# Patient Record
Sex: Female | Born: 1970 | Race: White | Hispanic: No | Marital: Married | Smoking: Never smoker
Health system: Southern US, Community
[De-identification: ages and names within clinical notes are randomized; demographics above are authoritative.]

## PROBLEM LIST (undated history)

## (undated) DIAGNOSIS — M419 Scoliosis, unspecified: Secondary | ICD-10-CM

## (undated) DIAGNOSIS — T7840XA Allergy, unspecified, initial encounter: Secondary | ICD-10-CM

## (undated) DIAGNOSIS — M6530 Trigger finger, unspecified finger: Secondary | ICD-10-CM

## (undated) HISTORY — PX: LASIK: SHX215

## (undated) HISTORY — DX: Allergy, unspecified, initial encounter: T78.40XA

---
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
## 2000-12-04 ENCOUNTER — Other Ambulatory Visit: Admission: RE | Admit: 2000-12-04 | Discharge: 2000-12-04 | Payer: Self-pay | Admitting: Obstetrics and Gynecology

## 2003-09-08 ENCOUNTER — Other Ambulatory Visit: Admission: RE | Admit: 2003-09-08 | Discharge: 2003-09-08 | Payer: Self-pay | Admitting: Obstetrics and Gynecology

## 2006-06-05 ENCOUNTER — Other Ambulatory Visit: Admission: RE | Admit: 2006-06-05 | Discharge: 2006-06-05 | Payer: Self-pay | Admitting: Obstetrics and Gynecology

## 2008-07-15 ENCOUNTER — Encounter: Admission: RE | Admit: 2008-07-15 | Discharge: 2008-07-15 | Payer: Self-pay | Admitting: Obstetrics and Gynecology

## 2009-09-15 ENCOUNTER — Ambulatory Visit (HOSPITAL_BASED_OUTPATIENT_CLINIC_OR_DEPARTMENT_OTHER): Admission: RE | Admit: 2009-09-15 | Discharge: 2009-09-15 | Payer: Self-pay | Admitting: Orthopedic Surgery

## 2011-08-07 ENCOUNTER — Ambulatory Visit
Admission: RE | Admit: 2011-08-07 | Discharge: 2011-08-07 | Disposition: A | Discharge status: Home / Self Care | Payer: 59 | Source: Ambulatory Visit | Attending: Obstetrics and Gynecology | Admitting: Obstetrics and Gynecology

## 2011-08-07 ENCOUNTER — Other Ambulatory Visit: Payer: Self-pay | Admitting: Obstetrics and Gynecology

## 2011-08-07 DIAGNOSIS — Z1231 Encounter for screening mammogram for malignant neoplasm of breast: Secondary | ICD-10-CM

## 2012-08-21 ENCOUNTER — Other Ambulatory Visit: Payer: Self-pay | Admitting: Obstetrics and Gynecology

## 2012-08-21 DIAGNOSIS — Z1231 Encounter for screening mammogram for malignant neoplasm of breast: Secondary | ICD-10-CM

## 2012-08-22 ENCOUNTER — Ambulatory Visit
Admission: RE | Admit: 2012-08-22 | Discharge: 2012-08-22 | Disposition: A | Discharge status: Home / Self Care | Payer: 59 | Source: Ambulatory Visit | Attending: Obstetrics and Gynecology | Admitting: Obstetrics and Gynecology

## 2012-08-22 DIAGNOSIS — Z1231 Encounter for screening mammogram for malignant neoplasm of breast: Secondary | ICD-10-CM

## 2014-01-15 ENCOUNTER — Other Ambulatory Visit (HOSPITAL_COMMUNITY): Payer: Self-pay | Admitting: Orthopaedic Surgery

## 2014-01-15 DIAGNOSIS — M25512 Pain in left shoulder: Secondary | ICD-10-CM

## 2014-01-21 ENCOUNTER — Ambulatory Visit (HOSPITAL_COMMUNITY)
Admission: RE | Admit: 2014-01-21 | Discharge: 2014-01-21 | Disposition: A | Discharge status: Home / Self Care | Payer: 59 | Source: Ambulatory Visit | Attending: Orthopaedic Surgery | Admitting: Orthopaedic Surgery

## 2014-01-21 DIAGNOSIS — M25512 Pain in left shoulder: Secondary | ICD-10-CM

## 2014-01-21 DIAGNOSIS — M25519 Pain in unspecified shoulder: Secondary | ICD-10-CM | POA: Insufficient documentation

## 2014-01-29 ENCOUNTER — Ambulatory Visit: Payer: 59 | Attending: Orthopaedic Surgery | Admitting: Physical Therapy

## 2014-01-29 DIAGNOSIS — M25619 Stiffness of unspecified shoulder, not elsewhere classified: Secondary | ICD-10-CM | POA: Insufficient documentation

## 2014-01-29 DIAGNOSIS — M25519 Pain in unspecified shoulder: Secondary | ICD-10-CM | POA: Insufficient documentation

## 2014-01-29 DIAGNOSIS — R293 Abnormal posture: Secondary | ICD-10-CM | POA: Insufficient documentation

## 2014-01-29 DIAGNOSIS — IMO0001 Reserved for inherently not codable concepts without codable children: Secondary | ICD-10-CM | POA: Insufficient documentation

## 2014-02-10 ENCOUNTER — Ambulatory Visit: Payer: 59 | Admitting: Physical Therapy

## 2014-02-12 ENCOUNTER — Ambulatory Visit: Payer: 59 | Admitting: Rehabilitation

## 2014-02-16 ENCOUNTER — Ambulatory Visit: Payer: 59 | Admitting: Physical Therapy

## 2014-02-19 ENCOUNTER — Ambulatory Visit: Payer: 59 | Admitting: Physical Therapy

## 2014-03-09 ENCOUNTER — Encounter: Payer: 59 | Admitting: Physical Therapy

## 2014-04-07 ENCOUNTER — Other Ambulatory Visit: Payer: Self-pay

## 2014-04-07 ENCOUNTER — Ambulatory Visit
Admission: RE | Admit: 2014-04-07 | Discharge: 2014-04-07 | Disposition: A | Discharge status: Home / Self Care | Payer: 59 | Source: Ambulatory Visit

## 2014-04-07 DIAGNOSIS — Z1231 Encounter for screening mammogram for malignant neoplasm of breast: Secondary | ICD-10-CM

## 2015-06-29 ENCOUNTER — Ambulatory Visit (INDEPENDENT_AMBULATORY_CARE_PROVIDER_SITE_OTHER): Payer: 59 | Admitting: Physician Assistant

## 2015-06-29 VITALS — BP 116/72 | HR 88 | Temp 98.5°F | Resp 18 | Ht 67.75 in | Wt 152.6 lb

## 2015-06-29 DIAGNOSIS — Z0000 Encounter for general adult medical examination without abnormal findings: Secondary | ICD-10-CM | POA: Diagnosis not present

## 2015-06-29 DIAGNOSIS — Z13220 Encounter for screening for lipoid disorders: Secondary | ICD-10-CM | POA: Diagnosis not present

## 2015-06-29 DIAGNOSIS — Z130 Encounter for screening for diseases of the blood and blood-forming organs and certain disorders involving the immune mechanism: Secondary | ICD-10-CM

## 2015-06-29 DIAGNOSIS — Z13228 Encounter for screening for other metabolic disorders: Secondary | ICD-10-CM

## 2015-06-29 DIAGNOSIS — Z1329 Encounter for screening for other suspected endocrine disorder: Secondary | ICD-10-CM

## 2015-06-29 LAB — CBC
HEMATOCRIT: 40.7 % (ref 36.0–46.0)
Hemoglobin: 13.7 g/dL (ref 12.0–15.0)
MCH: 29.2 pg (ref 26.0–34.0)
MCHC: 33.7 g/dL (ref 30.0–36.0)
MCV: 86.8 fL (ref 78.0–100.0)
MPV: 10.3 fL (ref 8.6–12.4)
PLATELETS: 312 10*3/uL (ref 150–400)
RBC: 4.69 MIL/uL (ref 3.87–5.11)
RDW: 13.8 % (ref 11.5–15.5)
WBC: 6.4 10*3/uL (ref 4.0–10.5)

## 2015-06-29 LAB — COMPLETE METABOLIC PANEL WITH GFR
ALT: 28 U/L (ref 6–29)
AST: 27 U/L (ref 10–35)
Albumin: 4.5 g/dL (ref 3.6–5.1)
Alkaline Phosphatase: 87 U/L (ref 33–115)
BUN: 16 mg/dL (ref 7–25)
CALCIUM: 9.8 mg/dL (ref 8.6–10.2)
CHLORIDE: 103 mmol/L (ref 98–110)
CO2: 29 mmol/L (ref 20–31)
Creat: 0.78 mg/dL (ref 0.50–1.10)
GFR, Est African American: >89 mL/min (ref >=60)
GFR, Est Non African American: >89 mL/min (ref >=60)
Glucose, Bld: 89 mg/dL (ref 65–99)
POTASSIUM: 4.6 mmol/L (ref 3.5–5.3)
SODIUM: 139 mmol/L (ref 135–146)
Total Bilirubin: 0.5 mg/dL (ref 0.2–1.2)
Total Protein: 7.3 g/dL (ref 6.1–8.1)

## 2015-06-29 LAB — LIPID PANEL
CHOL/HDL RATIO: 3.8 ratio (ref <=5.0)
CHOLESTEROL: 216 mg/dL — AB (ref 125–200)
HDL: 57 mg/dL (ref >=46)
LDL Cholesterol: 124 mg/dL (ref <130)
Triglycerides: 176 mg/dL — ABNORMAL HIGH (ref <150)
VLDL: 35 mg/dL — AB (ref <30)

## 2015-06-29 LAB — T4, FREE: FREE T4: 0.9 ng/dL (ref 0.80–1.80)

## 2015-06-29 LAB — TSH: TSH: 3.624 u[IU]/mL (ref 0.350–4.500)

## 2015-06-29 NOTE — Progress Notes (Signed)
[facility] [address] [place] [address] [phone]  Date:  06/29/2015   Name:  Samantha Floyd   DOB:  [birth year]   MRN:  [id number]  PCP:  Horton Finer, MD    History of Present Illness:  Samantha Floyd is a 44 y.o. female patient who presents to [facility] for annual physical exam.  -Diet: Fruits and vegetables.  Breakfast bar.  Healthy dinner.  Hydrates.  Soda per week.  Rare fried.   -BM: Normal.  Less frequent q2days.  No blood in the stool.  No diarrhea or constipation. -Urine: No dysuria, hematuria, no frequency.  -Sleep: Well, 9am-4:30am.  Through the night and no difficulty getting to sleep.  In a ROS, she recalls a decreased thyroid level (t4,tsh).  This was never followed up, after her pcp retired, more than a year ago.  No fatigue, constipatin, skin changes.  No chest pain or palpitations.  No nausea or vomiting, or dizziness.     -She has 2 children which started college-so now and empty nest.  Has a good relationship with husband and great support.  They are active at their church.  No sexual relation concerns.    Menses has recently become more irregular.  Last eye exam within the year.    Mammogram about 1 year ago, was normal.   -Appt. in November with gyn with Dr. Marvel Plan with [facility], where she will have her pap  There are no active problems to display for this patient.   History reviewed. No pertinent past medical history.  Past Surgical History  Procedure Laterality Date  . Adenoidectomy  10/30/1974    Social History  Substance Use Topics  . Smoking status: Never Smoker   . Smokeless tobacco: None  . Alcohol Use: No    Family History  Problem Relation Age of Onset  . Hypertension Mother   . Cancer Father   . Hypertension Father   . Heart disease Maternal Grandmother   . Cancer Maternal Grandfather   . Stroke Maternal Grandfather   . Heart disease Paternal Grandmother   . Hyperlipidemia Paternal  Grandmother   . Hypertension Paternal Grandmother   . Stroke Paternal Grandmother   . Diabetes Paternal Grandfather   . Heart disease Paternal Grandfather   . Hypertension Paternal Grandfather     No Known Allergies  Medication list has been reviewed and updated.  No current outpatient prescriptions on file prior to visit.   No current facility-administered medications on file prior to visit.    Review of Systems  Constitutional: Negative for fever and chills.  HENT: Negative for hearing loss and sore throat.   Eyes: Negative for blurred vision.  Respiratory: Negative for cough, shortness of breath and wheezing.   Cardiovascular: Negative for chest pain, palpitations and leg swelling.  Gastrointestinal: Negative for nausea, vomiting, diarrhea and constipation.  Genitourinary: Negative for dysuria, frequency and hematuria.  Skin: Negative for itching and rash.  Neurological: Negative for dizziness.     Physical Examination: BP 116/72 mmHg  Pulse 88  Temp(Src) 98.5 F (36.9 C) (Oral)  Resp 18  Ht 5' 7.75" (1.721 m)  Wt 152 lb 9.6 oz (69.219 kg)  BMI 23.37 kg/m2  SpO2 98%  LMP 06/21/2015 Ideal Body Weight: Weight in (lb) to have BMI = 25: 162.9  Physical Exam  Constitutional: She is oriented to person, place, and time. She appears well-developed and well-nourished.  HENT:  Head: Normocephalic and atraumatic.  Right  Ear: External ear normal.  Left Ear: External ear normal.  Nose: Nose normal.  Mouth/Throat: Oropharynx is clear and moist.  Eyes: Conjunctivae and EOM are normal. Pupils are equal, round, and reactive to light.  Cardiovascular: Normal rate, regular rhythm and normal heart sounds.  Exam reveals no friction rub.   No murmur heard. Pulses:      Radial pulses are 2+ on the right side, and 2+ on the left side.  Pulmonary/Chest: Effort normal and breath sounds normal. No respiratory distress. She has no wheezes.  Abdominal: Soft. Bowel sounds are normal.  She exhibits no distension and no mass. There is no tenderness.  Musculoskeletal: Normal range of motion. She exhibits no edema or tenderness.  Neurological: She is alert and oriented to person, place, and time. She displays no tremor. No cranial nerve deficit. She exhibits normal muscle tone. She displays a negative Romberg sign. Coordination normal.  Skin: Skin is warm and dry.  Psychiatric: She has a normal mood and affect. Her behavior is normal.     Visual Acuity Screening   Right eye Left eye Both eyes  Without correction: 20/20 20/20 20/15   With correction:       Assessment and Plan: 44 year old female is here today for annual physical exam. She declines an influenza test today, but will have it with her work at [employer] ~ October She will schedule her mammogram herself. Pap scheduled for November.  Annual physical exam - Plan: CBC, COMPLETE METABOLIC PANEL WITH GFR, Lipid panel, TSH, T4, Free  Screening for metabolic disorder - Plan: COMPLETE METABOLIC PANEL WITH GFR  Screening for thyroid disorder - Plan: TSH, T4, Free  Screening for lipid disorders - Plan: Lipid panel  Screening for deficiency anemia - Plan: CBC    Ivar Drape, PA-C [facility] 06/29/2015 8:22 AM

## 2015-06-29 NOTE — Patient Instructions (Signed)
I will have your lab results within the next 10-14 days. Please try to get in 30 minutes of aerobic work 4 times per week.   Please schedule your mammogram.  Keeping You Healthy  Get These Tests 1. Blood Pressure- Have your blood pressure checked once a year by your health care provider.  Normal blood pressure is 120/80. 2. Weight- Have your body mass index (BMI) calculated to screen for obesity.  BMI is measure of body fat based on height and weight.  You can also calculate your own BMI at GravelBags.it. 3. Cholesterol- Have your cholesterol checked every 5 years starting at age 46 then yearly starting at age 46. 87. Chlamydia, HIV, and other sexually transmitted diseases- Get screened every year until age 46, then within three months of each new sexual provider. 5. Pap Test - Every 1-5 years; discuss with your health care provider. 6. Mammogram- Every 1-2 years starting at age 46--50  Take these medicines  Calcium with Vitamin D-Your body needs 1200 mg of Calcium each day and [phone] IU of Vitamin D daily.  Your body can only absorb 500 mg of Calcium at a time so Calcium must be taken in 2 or 3 divided doses throughout the day.  Multivitamin with folic acid- Once daily if it is possible for you to become pregnant.  Get these Immunizations  Gardasil-Series of three doses; prevents HPV related illness such as genital warts and cervical cancer.  Menactra-Single dose; prevents meningitis.  Tetanus shot- Every 10 years.  Flu shot-Every year.  Take these steps 1. Do not smoke-Your healthcare provider can help you quit.  For tips on how to quit go to www.smokefree.gov or call 1-800 QUITNOW. 2. Be physically active- Exercise 5 days a week for at least 30 minutes.  If you are not already physically active, start slow and gradually work up to 30 minutes of moderate physical activity.  Examples of moderate activity include walking briskly, dancing, swimming, bicycling,  etc. 3. Breast Cancer- A self breast exam every month is important for early detection of breast cancer.  For more information and instruction on self breast exams, ask your healthcare provider or https://www.patel.info/. 4. Eat a healthy diet- Eat a variety of healthy foods such as fruits, vegetables, whole grains, low fat milk, low fat cheeses, yogurt, lean meats, poultry and fish, beans, nuts, tofu, etc.  For more information go to www. Thenutritionsource.org 5. Drink alcohol in moderation- Limit alcohol intake to one drink or less per day. Never drink and drive. 6. Depression- Your emotional health is as important as your physical health.  If you're feeling down or losing interest in things you normally enjoy please talk to your healthcare provider about being screened for depression. 7. Dental visit- Brush and floss your teeth twice daily; visit your dentist twice a year. 8. Eye doctor- Get an eye exam at least every 2 years. 9. Helmet use- Always wear a helmet when riding a bicycle, motorcycle, rollerblading or skateboarding. 1. Safe sex- If you may be exposed to sexually transmitted infections, use a condom. 11. Seat belts- Seat belts can save your live; always wear one. 12. Smoke/Carbon Monoxide detectors- These detectors need to be installed on the appropriate level of your home. Replace batteries at least once a year. 13. Skin cancer- When out in the sun please cover up and use sunscreen 15 SPF or higher. 14. Violence- If anyone is threatening or hurting you, please tell your healthcare provider.

## 2015-08-11 ENCOUNTER — Ambulatory Visit
Admission: RE | Admit: 2015-08-11 | Discharge: 2015-08-11 | Disposition: A | Discharge status: Home / Self Care | Payer: 59 | Source: Ambulatory Visit

## 2015-08-11 ENCOUNTER — Other Ambulatory Visit: Payer: Self-pay

## 2015-08-11 DIAGNOSIS — Z1231 Encounter for screening mammogram for malignant neoplasm of breast: Secondary | ICD-10-CM

## 2015-10-08 ENCOUNTER — Ambulatory Visit (INDEPENDENT_AMBULATORY_CARE_PROVIDER_SITE_OTHER): Payer: 59 | Admitting: Internal Medicine

## 2015-10-08 ENCOUNTER — Encounter: Payer: Self-pay | Admitting: Internal Medicine

## 2015-10-08 VITALS — BP 105/73 | HR 82 | Temp 98.7°F | Resp 16 | Ht 67.0 in | Wt 154.0 lb

## 2015-10-08 DIAGNOSIS — H918X2 Other specified hearing loss, left ear: Secondary | ICD-10-CM | POA: Diagnosis not present

## 2015-10-08 DIAGNOSIS — H6502 Acute serous otitis media, left ear: Secondary | ICD-10-CM

## 2015-10-08 DIAGNOSIS — H9192 Unspecified hearing loss, left ear: Secondary | ICD-10-CM

## 2015-10-08 DIAGNOSIS — Z23 Encounter for immunization: Secondary | ICD-10-CM | POA: Diagnosis not present

## 2015-10-08 MED ORDER — PREDNISONE 20 MG PO TABS: ORAL_TABLET | ORAL | Status: DC

## 2015-10-08 MED ORDER — CETIRIZINE-PSEUDOEPHEDRINE ER 5-120 MG PO TB12
1.0000 | ORAL_TABLET | Freq: Two times a day (BID) | ORAL | Status: DC
Start: 2015-10-08 — End: 2016-08-28

## 2015-10-08 NOTE — Progress Notes (Signed)
   Subjective:    Patient ID: Samantha Floyd, female    DOB: 05-Oct-1971, 44 y.o.   MRN: [id number]  HPIc/o recent dizziness-positional vertigo sxt with muffling l ear--sl ring and sl decr hearing ?started p cold or allerg but "stopped up feeling continues after vertigo has resolved  Hx-tubes as child  Pap 11/4 Dr Marvel Plan Review of [facility] [place]    Objective:   Physical Exam BP 105/73 mmHg  Pulse 82  Temp(Src) 98.7 F (37.1 C)  Resp 16  Ht 5\' 7"  (1.702 m)  Wt 154 lb (69.854 kg)  BMI 24.11 kg/m2 PERRLA w/ EOMs conj Tm L has fluid bubbles and old opaque Tm scar R Tm opaque area but mostly clear decr hearing on L Nares cl No nodes Neuro intact       Assessment & Plan:  Acute serous otitis media of left ear, recurrence not specified  Need for Tdap vaccination - Plan: Tdap vaccine greater than or equal to 7yo IM  Hearing loss associated with syndrome, left  Meds ordered this encounter  Medications  . predniSONE (DELTASONE) 20 MG tablet    Sig: 3/3/2/2/1/1 single daily dose for 6 days    Dispense:  12 tablet    Refill:  0  . cetirizine-pseudoephedrine (ZYRTEC-D) 5-120 MG tablet    Sig: Take 1 tablet by mouth 2 (two) times daily.    Dispense:  60 tablet    Refill:  1   If not better in 10d call for ref to dr Thornell Mule

## 2015-12-06 DIAGNOSIS — H524 Presbyopia: Secondary | ICD-10-CM | POA: Diagnosis not present

## 2016-08-28 ENCOUNTER — Other Ambulatory Visit: Payer: Self-pay | Admitting: Obstetrics and Gynecology

## 2016-08-28 ENCOUNTER — Ambulatory Visit (INDEPENDENT_AMBULATORY_CARE_PROVIDER_SITE_OTHER): Payer: 59 | Admitting: Family Medicine

## 2016-08-28 VITALS — BP 108/60 | HR 78 | Temp 98.6°F | Resp 17 | Ht 67.0 in | Wt 139.0 lb

## 2016-08-28 DIAGNOSIS — Z13220 Encounter for screening for lipoid disorders: Secondary | ICD-10-CM

## 2016-08-28 DIAGNOSIS — Z1231 Encounter for screening mammogram for malignant neoplasm of breast: Secondary | ICD-10-CM

## 2016-08-28 DIAGNOSIS — Z0000 Encounter for general adult medical examination without abnormal findings: Secondary | ICD-10-CM

## 2016-08-28 DIAGNOSIS — Z1329 Encounter for screening for other suspected endocrine disorder: Secondary | ICD-10-CM

## 2016-08-28 LAB — POCT URINALYSIS DIP (MANUAL ENTRY)
BILIRUBIN UA: NEGATIVE
Bilirubin, UA: NEGATIVE
Glucose, UA: NEGATIVE
Nitrite, UA: NEGATIVE
PROTEIN UA: NEGATIVE
Urobilinogen, UA: 0.2
pH, UA: 5.5

## 2016-08-28 LAB — POC MICROSCOPIC URINALYSIS (UMFC): MUCUS RE: ABSENT

## 2016-08-28 LAB — POCT URINE PREGNANCY: PREG TEST UR: NEGATIVE

## 2016-08-28 MED ORDER — FEXOFENADINE HCL 30 MG PO TABS: 30.0000 mg | ORAL_TABLET | Freq: Every day | ORAL | 3 refills | Status: DC

## 2016-08-28 NOTE — Progress Notes (Addendum)
Patient ID: Samantha Floyd, female    DOB: [birth year], 45 y.o.   MRN: [id number]  PCP: No PCP Per Patient  Chief Complaint  Patient presents with  . Annual Exam    Subjective:   HPI 45 year old female presents for a complete annual physical. 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 Reports a history of chronic allergies which has led to chronic swelling of her tonsils.  She reports ongoing problems with bilateral ear pain and is considering getting a tonsillectomy. She takes Allegra-D daily. Reports recent intentional weight loss totaling 20 lbs since April. Reports that she is conscientious about her weight as her weight increases she experiences episodes of low back pain.  Denies any other acute problems.  Social History   Social History  . Marital status: Married    Spouse name: N/A  . Number of children: N/A  . Years of education: N/A   Occupational History  . Not on file.   Social History Main Topics  . Smoking status: Never Smoker  . Smokeless tobacco: Not on file  . Alcohol use No  . Drug use: No  . Sexual activity: Not on file   Other Topics Concern  . Not on file   Social History Narrative  . No narrative on file    Review of Systems  Constitutional: Positive for appetite change and fatigue.       20 lbs  HENT: Positive for ear discharge and hearing loss.        Toncils consider Dental visits  Eyes:       Bifocal for reading. Recent eye exam this year.  Respiratory: Negative.   Cardiovascular: Negative.   Gastrointestinal: Negative.        Everyday stool patterns.  Endocrine: Negative.   Genitourinary: Negative.  Negative for vaginal discharge.  Musculoskeletal:       See HPI  Skin: Negative.   Neurological: Positive for dizziness and headaches.  Hematological: Negative.   Psychiatric/Behavioral: Negative.    There are no active problems to display for this patient.   Prior to Admission  medications   Medication Sig Start Date End Date Taking? Authorizing Provider  fexofenadine (ALLEGRA) 30 MG tablet Take 30 mg by mouth daily.   Yes Historical Provider, MD  ibuprofen (ADVIL,MOTRIN) 200 MG tablet Take 200 mg by mouth every 8 (eight) hours as needed.   Yes Historical Provider, MD  Multiple Vitamins-Minerals (MULTIVITAMIN WITH MINERALS) tablet Take 1 tablet by mouth daily.   Yes Historical Provider, MD   No Known Allergies     Objective:  Physical Exam  Constitutional: She is oriented to person, place, and time. She appears well-developed and well-nourished.  HENT:  Head: Normocephalic and atraumatic.  Right Ear: External ear normal.  Left Ear: External ear normal.  Mouth/Throat: Oropharynx is clear and moist.  Eyes: Conjunctivae and EOM are normal. Pupils are equal, round, and reactive to light.  Neck: Normal range of motion. Neck supple.  Cardiovascular: Normal rate, regular rhythm, normal heart sounds and intact distal pulses.   Pulmonary/Chest: Effort normal and breath sounds normal.  Abdominal: Soft. Bowel sounds are normal.  Musculoskeletal: Normal range of motion.  Neurological: She is alert and oriented to person, place, and time.  Skin: Skin is warm and dry.  Psychiatric: She has a normal mood and affect. Her behavior is normal. Judgment and thought content normal.    Vitals:  08/28/16 1437  BP: 108/60  Pulse: 78  Resp: 17  Temp: 98.6 F (37 C)   Vitals:   08/28/16 1437  Weight: 139 lb (63 kg)  Height: 5\' 7"  (1.702 m)   Assessment & Plan:  1. Annual physical exam - POCT urinalysis dipstick - POCT Microscopic Urinalysis (UMFC) - POCT urine pregnancy - CBC with Differential/Platelet - COMPLETE METABOLIC PANEL WITH GFR  2. Screening, lipid - Lipid panel  3. Thyroid disorder screening - Thyroid Panel With TSH  Age-appropriate anticipatory guidance provided.  Will follow-up with you regarding any abnormal labs. Normal labs will be updated to  your MyChart account.  Carroll Sage. [name], MSN, FNP-C [facility]

## 2016-08-28 NOTE — Patient Instructions (Addendum)
Nice meeting you!  Your lab results will be updated on your MyChart.  Samantha Floyd. [name], MSN, FNP-C [facility]   IF you received an x-ray today, you will receive an invoice from [facility]. Please contact [facility] at [phone] with questions or concerns regarding your invoice.   IF you received labwork today, you will receive an invoice from [employer]. Please contact [facility] at [phone] with questions or concerns regarding your invoice.   Our billing staff will not be able to assist you with questions regarding bills from these companies.  You will be contacted with the lab results as soon as they are available. The fastest way to get your results is to activate your My Chart account. Instructions are located on the last page of this paperwork. If you have not heard from [place] regarding the results in 2 weeks, please contact this office.     Exercising to Stay Healthy Exercising regularly is important. It has many health benefits, such as:  Improving your overall fitness, flexibility, and endurance.  Increasing your bone density.  Helping with weight control.  Decreasing your body fat.  Increasing your muscle strength.  Reducing stress and tension.  Improving your overall health. In order to become healthy and stay healthy, it is recommended that you do moderate-intensity and vigorous-intensity exercise. You can tell that you are exercising at a moderate intensity if you have a higher heart rate and faster breathing, but you are still able to hold a conversation. You can tell that you are exercising at a vigorous intensity if you are breathing much harder and faster and cannot hold a conversation while exercising. HOW OFTEN SHOULD I EXERCISE? Choose an activity that you enjoy and set realistic goals. Your health care provider can help you to make an activity plan that works for  you. Exercise regularly as directed by your health care provider. This may include:   Doing resistance training twice each week, such as:  Push-ups.  Sit-ups.  Lifting weights.  Using resistance bands.  Doing a given intensity of exercise for a given amount of time. Choose from these options:  150 minutes of moderate-intensity exercise every week.  75 minutes of vigorous-intensity exercise every week.  A mix of moderate-intensity and vigorous-intensity exercise every week. Children, pregnant women, people who are out of shape, people who are overweight, and older adults may need to consult a health care provider for individual recommendations. If you have any sort of medical condition, be sure to consult your health care provider before starting a new exercise program.  WHAT ARE SOME EXERCISE IDEAS? Some moderate-intensity exercise ideas include:   Walking at a rate of 1 mile in 15 minutes.  Biking.  Hiking.  Golfing.  Dancing. Some vigorous-intensity exercise ideas include:   Walking at a rate of at least 4.5 miles per hour.  Jogging or running at a rate of 5 miles per hour.  Biking at a rate of at least 10 miles per hour.  Lap swimming.  Roller-skating or in-line skating.  Cross-country skiing.  Vigorous competitive sports, such as football, basketball, and soccer.  Jumping rope.  Aerobic dancing. WHAT ARE SOME EVERYDAY ACTIVITIES THAT CAN HELP ME TO GET EXERCISE?  Yard work, such as:  Psychologist, educational.  Raking and bagging leaves.  Washing and waxing your car.  Pushing a stroller.  Shoveling snow.  Gardening.  Washing windows or floors. HOW CAN I BE  MORE ACTIVE IN MY DAY-TO-DAY ACTIVITIES?  Use the stairs instead of the elevator.  Take a walk during your lunch break.  If you drive, park your car farther away from work or school.  If you take public transportation, get off one stop early and walk the rest of the way.  Make all of your  phone calls while standing up and walking around.  Get up, stretch, and walk around every 30 minutes throughout the day. WHAT GUIDELINES SHOULD I FOLLOW WHILE EXERCISING?  Do not exercise so much that you hurt yourself, feel dizzy, or get very short of breath.  Consult your health care provider before starting a new exercise program.  Wear comfortable clothes and shoes with good support.  Drink plenty of water while you exercise to prevent dehydration or heat stroke. Body water is lost during exercise and must be replaced.  Work out until you breathe faster and your heart beats faster.   This information is not intended to replace advice given to you by your health care provider. Make sure you discuss any questions you have with your health care provider.   Document Released: 11/18/2010 Document Revised: 11/06/2014 Document Reviewed: 03/19/2014 Elsevier Interactive Patient Education [employer].

## 2016-08-29 ENCOUNTER — Other Ambulatory Visit (HOSPITAL_COMMUNITY)
Admission: RE | Admit: 2016-08-29 | Discharge: 2016-08-29 | Disposition: A | Discharge status: Home / Self Care | Payer: 59 | Source: Ambulatory Visit | Attending: Physician Assistant | Admitting: Physician Assistant

## 2016-08-29 DIAGNOSIS — Z13220 Encounter for screening for lipoid disorders: Secondary | ICD-10-CM | POA: Insufficient documentation

## 2016-08-29 LAB — TSH: TSH: 7.17 u[IU]/mL — AB (ref 0.350–4.500)

## 2016-08-29 LAB — COMPREHENSIVE METABOLIC PANEL
ALBUMIN: 4.2 g/dL (ref 3.5–5.0)
ALK PHOS: 74 U/L (ref 38–126)
ALT: 15 U/L (ref 14–54)
AST: 18 U/L (ref 15–41)
Anion gap: 8 (ref 5–15)
BILIRUBIN TOTAL: 0.9 mg/dL (ref 0.3–1.2)
BUN: 17 mg/dL (ref 6–20)
CALCIUM: 9.6 mg/dL (ref 8.9–10.3)
CO2: 26 mmol/L (ref 22–32)
CREATININE: 0.78 mg/dL (ref 0.44–1.00)
Chloride: 105 mmol/L (ref 101–111)
GFR calc Af Amer: >60 mL/min (ref >60)
GFR calc non Af Amer: >60 mL/min (ref >60)
GLUCOSE: 85 mg/dL (ref 65–99)
Potassium: 4.7 mmol/L (ref 3.5–5.1)
Sodium: 139 mmol/L (ref 135–145)
TOTAL PROTEIN: 6.8 g/dL (ref 6.5–8.1)

## 2016-08-29 LAB — CBC
HCT: 40.5 % (ref 36.0–46.0)
Hemoglobin: 13.5 g/dL (ref 12.0–15.0)
MCH: 29.9 pg (ref 26.0–34.0)
MCHC: 33.3 g/dL (ref 30.0–36.0)
MCV: 89.8 fL (ref 78.0–100.0)
Platelets: 274 10*3/uL (ref 150–400)
RBC: 4.51 MIL/uL (ref 3.87–5.11)
RDW: 13.7 % (ref 11.5–15.5)
WBC: 7.4 10*3/uL (ref 4.0–10.5)

## 2016-08-29 LAB — LIPID PANEL
CHOLESTEROL: 204 mg/dL — AB (ref 0–200)
HDL: 62 mg/dL (ref >40)
LDL Cholesterol: 128 mg/dL — ABNORMAL HIGH (ref 0–99)
Total CHOL/HDL Ratio: 3.3 RATIO
Triglycerides: 70 mg/dL (ref <150)
VLDL: 14 mg/dL (ref 0–40)

## 2016-08-29 LAB — T4, FREE: Free T4: 0.86 ng/dL (ref 0.61–1.12)

## 2016-08-30 LAB — T3, FREE: T3 FREE: 2.9 pg/mL (ref 2.0–4.4)

## 2016-11-07 DIAGNOSIS — Z01419 Encounter for gynecological examination (general) (routine) without abnormal findings: Secondary | ICD-10-CM | POA: Diagnosis not present

## 2016-11-07 DIAGNOSIS — Z6824 Body mass index (BMI) 24.0-24.9, adult: Secondary | ICD-10-CM | POA: Diagnosis not present

## 2016-12-25 DIAGNOSIS — H524 Presbyopia: Secondary | ICD-10-CM | POA: Diagnosis not present

## 2016-12-27 ENCOUNTER — Telehealth: Payer: 59 | Admitting: Family

## 2016-12-27 ENCOUNTER — Ambulatory Visit: Payer: 59

## 2016-12-27 DIAGNOSIS — L255 Unspecified contact dermatitis due to plants, except food: Secondary | ICD-10-CM | POA: Diagnosis not present

## 2016-12-27 MED ORDER — CLOBETASOL PROPIONATE 0.05 % EX CREA: 1.0000 "application " | TOPICAL_CREAM | Freq: Two times a day (BID) | CUTANEOUS | 0 refills | Status: DC

## 2016-12-27 MED FILL — CLOBETASOL 0.05% CREAM: 0.05 | 30 days supply | Qty: 60 | Fill #0

## 2016-12-27 NOTE — Progress Notes (Signed)
E Visit for Rash  We are sorry that you are not feeling well. Here is how we plan to help!  Based on what you shared with me it looks like you have contact dermatitis.  Contact dermatitis is a skin rash caused by something that touches the skin and causes irritation or inflammation.  Your skin may be red, swollen, dry, cracked, and itch.  The rash should go away in a few days but can last a few weeks.  If you get a rash, it's important to figure out what caused it so the irritant can be avoided in the future.    I sent in clobetasol cream.   HOME CARE:   Take cool showers and avoid direct sunlight.  Apply cool compress or wet dressings.  Take a bath in an oatmeal bath.  Sprinkle content of one Aveeno packet under running faucet with comfortably warm water.  Bathe for 15-20 minutes, 1-2 times daily.  Pat dry with a towel. Do not rub the rash.  Use hydrocortisone cream.  Take an antihistamine like Benadryl for widespread rashes that itch.  The adult dose of Benadryl is 25-50 mg by mouth 4 times daily.  Caution:  This type of medication may cause sleepiness.  Do not drink alcohol, drive, or operate dangerous machinery while taking antihistamines.  Do not take these medications if you have prostate enlargement.  Read package instructions thoroughly on all medications that you take.  GET HELP RIGHT AWAY IF:   Symptoms don't go away after treatment.  Severe itching that persists.  If you rash spreads or swells.  If you rash begins to smell.  If it blisters and opens or develops a yellow-brown crust.  You develop a fever.  You have a sore throat.  You become short of breath.  MAKE SURE YOU:  Understand these instructions. Will watch your condition. Will get help right away if you are not doing well or get worse.  Thank you for choosing an e-visit. Your e-visit answers were reviewed by a board certified advanced clinical practitioner to complete your personal care plan.  Depending upon the condition, your plan could have included both over the counter or prescription medications. Please review your pharmacy choice. Be sure that the pharmacy you have chosen is open so that you can pick up your prescription now.  If there is a problem you may message your provider in [place] to have the prescription routed to another pharmacy. Your safety is important to [place]. If you have drug allergies check your prescription carefully.  For the next 24 hours, you can use MyChart to ask questions about today's visit, request a non-urgent call back, or ask for a work or school excuse from your e-visit provider. You will get an email in the next two days asking about your experience. I hope that your e-visit has been valuable and will speed your recovery.

## 2018-02-06 ENCOUNTER — Encounter: Payer: Self-pay | Admitting: Physician Assistant

## 2018-04-08 ENCOUNTER — Other Ambulatory Visit: Payer: Self-pay | Admitting: Obstetrics and Gynecology

## 2018-04-08 DIAGNOSIS — Z1231 Encounter for screening mammogram for malignant neoplasm of breast: Secondary | ICD-10-CM

## 2018-04-09 ENCOUNTER — Ambulatory Visit
Admission: RE | Admit: 2018-04-09 | Discharge: 2018-04-09 | Disposition: A | Discharge status: Home / Self Care | Payer: 59 | Source: Ambulatory Visit | Attending: Obstetrics and Gynecology | Admitting: Obstetrics and Gynecology

## 2018-04-09 DIAGNOSIS — Z1231 Encounter for screening mammogram for malignant neoplasm of breast: Secondary | ICD-10-CM | POA: Diagnosis not present

## 2018-07-16 ENCOUNTER — Telehealth: Payer: 59 | Admitting: Family Medicine

## 2018-07-16 DIAGNOSIS — R059 Cough, unspecified: Secondary | ICD-10-CM

## 2018-07-16 DIAGNOSIS — R05 Cough: Secondary | ICD-10-CM | POA: Diagnosis not present

## 2018-07-16 MED ORDER — AZITHROMYCIN 250 MG PO TABS: ORAL_TABLET | ORAL | 0 refills | Status: DC

## 2018-07-16 MED ORDER — BENZONATATE 200 MG PO CAPS: 200.0000 mg | ORAL_CAPSULE | Freq: Three times a day (TID) | ORAL | 0 refills | Status: DC | PRN

## 2018-07-16 MED FILL — AZITHROMYCIN 250 MG TABLET: 250 | 5 days supply | Qty: 6 | Fill #0

## 2018-07-16 MED FILL — BENZONATATE 200 MG CAPS: 200 | 8 days supply | Qty: 20 | Fill #0

## 2018-07-16 NOTE — Progress Notes (Signed)

## 2018-08-21 ENCOUNTER — Telehealth: Payer: Self-pay | Admitting: Family Medicine

## 2018-08-21 NOTE — Telephone Encounter (Signed)
LVM for pt to call and verify if they can make the scheduled appt time of 3:30 instead of the 3:40 time that was originally scheduled for 10/09/18 with Dr/ Brigitte Pulse. Arrival time of 3:20.  If pt calls back, please leave in notes.  Thank you!

## 2018-09-16 ENCOUNTER — Telehealth: Payer: Self-pay | Admitting: Family Medicine

## 2018-09-16 NOTE — Telephone Encounter (Signed)
LVM for pt to call and reschedule their appt scheduled for 10/09/18  due to Brigitte Pulse being unavailable that day. When pt calls back, please reschedule at their convenience.   Thank you!

## 2018-10-09 ENCOUNTER — Encounter: Payer: 59 | Admitting: Family Medicine

## 2018-11-04 ENCOUNTER — Telehealth: Payer: Self-pay | Admitting: Family Medicine

## 2018-11-05 ENCOUNTER — Encounter: Payer: 59 | Admitting: Family Medicine

## 2018-12-13 ENCOUNTER — Encounter: Payer: 59 | Admitting: Family Medicine

## 2018-12-20 ENCOUNTER — Telehealth: Payer: 59 | Admitting: Family

## 2018-12-20 DIAGNOSIS — J111 Influenza due to unidentified influenza virus with other respiratory manifestations: Secondary | ICD-10-CM | POA: Diagnosis not present

## 2018-12-20 DIAGNOSIS — R6889 Other general symptoms and signs: Secondary | ICD-10-CM | POA: Diagnosis not present

## 2018-12-20 MED ORDER — OSELTAMIVIR PHOSPHATE 75 MG PO CAPS: 75.0000 mg | ORAL_CAPSULE | Freq: Two times a day (BID) | ORAL | 0 refills | Status: DC

## 2018-12-20 MED FILL — OSELTAMIVIR PHOSPHATE 75 MG: 75 | 5 days supply | Qty: 10 | Fill #0

## 2018-12-20 NOTE — Progress Notes (Signed)

## 2019-01-23 ENCOUNTER — Other Ambulatory Visit: Payer: Self-pay

## 2019-01-23 DIAGNOSIS — Z131 Encounter for screening for diabetes mellitus: Secondary | ICD-10-CM

## 2019-01-23 DIAGNOSIS — Z1389 Encounter for screening for other disorder: Secondary | ICD-10-CM

## 2019-01-23 DIAGNOSIS — Z13220 Encounter for screening for lipoid disorders: Secondary | ICD-10-CM

## 2019-01-23 DIAGNOSIS — Z0000 Encounter for general adult medical examination without abnormal findings: Secondary | ICD-10-CM

## 2019-01-28 ENCOUNTER — Encounter: Payer: 59 | Admitting: Family Medicine

## 2019-03-08 NOTE — Progress Notes (Signed)
Greater than 5 minutes, yet less than 10 minutes of time have been spent researching, coordinating, and implementing care for this patient today.  Thank you for the details you included in the comment boxes. Those details are very helpful in determining the best course of treatment for you and help us to provide the best care.  

## 2019-03-12 ENCOUNTER — Other Ambulatory Visit (HOSPITAL_BASED_OUTPATIENT_CLINIC_OR_DEPARTMENT_OTHER): Payer: Self-pay | Admitting: *Deleted

## 2019-03-12 DIAGNOSIS — Z1389 Encounter for screening for other disorder: Secondary | ICD-10-CM

## 2019-03-12 DIAGNOSIS — Z13220 Encounter for screening for lipoid disorders: Secondary | ICD-10-CM

## 2019-03-12 DIAGNOSIS — Z131 Encounter for screening for diabetes mellitus: Secondary | ICD-10-CM

## 2019-03-14 ENCOUNTER — Ambulatory Visit (INDEPENDENT_AMBULATORY_CARE_PROVIDER_SITE_OTHER): Payer: 59 | Admitting: Family Medicine

## 2019-03-14 ENCOUNTER — Other Ambulatory Visit: Payer: Self-pay

## 2019-03-14 ENCOUNTER — Encounter: Payer: Self-pay | Admitting: Family Medicine

## 2019-03-14 ENCOUNTER — Other Ambulatory Visit (HOSPITAL_COMMUNITY)
Admission: RE | Admit: 2019-03-14 | Discharge: 2019-03-14 | Disposition: A | Discharge status: Home / Self Care | Payer: 59 | Source: Ambulatory Visit | Attending: Family Medicine | Admitting: Family Medicine

## 2019-03-14 VITALS — BP 124/80 | HR 88 | Temp 98.7°F | Ht 67.0 in | Wt 161.0 lb

## 2019-03-14 DIAGNOSIS — Z1321 Encounter for screening for nutritional disorder: Secondary | ICD-10-CM | POA: Diagnosis not present

## 2019-03-14 DIAGNOSIS — Z13220 Encounter for screening for lipoid disorders: Secondary | ICD-10-CM

## 2019-03-14 DIAGNOSIS — Z124 Encounter for screening for malignant neoplasm of cervix: Secondary | ICD-10-CM | POA: Insufficient documentation

## 2019-03-14 DIAGNOSIS — R946 Abnormal results of thyroid function studies: Secondary | ICD-10-CM | POA: Diagnosis not present

## 2019-03-14 DIAGNOSIS — Z1211 Encounter for screening for malignant neoplasm of colon: Secondary | ICD-10-CM

## 2019-03-14 DIAGNOSIS — Z131 Encounter for screening for diabetes mellitus: Secondary | ICD-10-CM | POA: Diagnosis not present

## 2019-03-14 DIAGNOSIS — Z1329 Encounter for screening for other suspected endocrine disorder: Secondary | ICD-10-CM | POA: Diagnosis not present

## 2019-03-14 DIAGNOSIS — Z01411 Encounter for gynecological examination (general) (routine) with abnormal findings: Secondary | ICD-10-CM | POA: Diagnosis not present

## 2019-03-14 DIAGNOSIS — Z130 Encounter for screening for diseases of the blood and blood-forming organs and certain disorders involving the immune mechanism: Secondary | ICD-10-CM | POA: Diagnosis not present

## 2019-03-14 DIAGNOSIS — Z13228 Encounter for screening for other metabolic disorders: Secondary | ICD-10-CM

## 2019-03-14 DIAGNOSIS — Z1231 Encounter for screening mammogram for malignant neoplasm of breast: Secondary | ICD-10-CM | POA: Diagnosis not present

## 2019-03-14 DIAGNOSIS — Z01419 Encounter for gynecological examination (general) (routine) without abnormal findings: Secondary | ICD-10-CM | POA: Diagnosis not present

## 2019-03-14 NOTE — Progress Notes (Signed)
5/15/20203:00 PM  Samantha Floyd Dec 27, 1970, 48 y.o., female [id number]  Chief Complaint  Patient presents with  . Annual Exam  . Gynecologic Exam    HPI:   Patient is a 48 y.o. female who presents today for WWE  G&Ps: 2002, SVD x2 Pap: 2016, normal, denies h/o abnormal STD: denies any concerns, denies any testing today BC : husband had vasectomy Menses: monthly, starting to spread, feels she is a bit warmer but not hotflashes, denies vaginal dryness, incontinence, mood swings Mammogram: June 2019 - birads 1, denies h/o abnormal FHX breast/ovarian cancer: denies  FHx colon cancer: denies  Exercise/diet: used to exercise regularly, now about once a week. Diet is reasonable, eats mostly home cooked, does not drink soda, drinks lots of water  Most Recent Immunizations  Administered Date(s) Administered  . Influenza Split 08/09/2015  . Influenza-Unspecified 07/26/2018  . Tdap 10/08/2015    Fall Risk  03/14/2019 03/14/2019 10/08/2015  Falls in the past year? 0 0 No  Number falls in past yr: 0 0 -  Injury with Fall? 0 0 -     Depression screen [facility] 2/9 03/14/2019 03/14/2019 08/28/2016  Decreased Interest 0 0 0  Down, Depressed, Hopeless 0 0 0  PHQ - 2 Score 0 0 0    No Known Allergies  Prior to Admission medications   Not on File    No past medical history on file.  Past Surgical History:  Procedure Laterality Date  . ADENOIDECTOMY  10/30/1974    Social History   Tobacco Use  . Smoking status: Never Smoker  . Smokeless tobacco: Never Used  Substance Use Topics  . Alcohol use: No    Alcohol/week: 0.0 standard drinks    Family History  Problem Relation Age of Onset  . Hypertension Mother   . Cancer Father   . Hypertension Father   . Heart disease Maternal Grandmother   . Cancer Maternal Grandfather   . Stroke Maternal Grandfather   . Heart disease Paternal Grandmother   . Hyperlipidemia Paternal Grandmother   . Hypertension Paternal Grandmother   .  Stroke Paternal Grandmother   . Diabetes Paternal Grandfather   . Heart disease Paternal Grandfather   . Hypertension Paternal Grandfather     Review of Systems  Constitutional: Negative for chills and fever.  Respiratory: Negative for cough and shortness of breath.   Cardiovascular: Negative for chest pain, palpitations and leg swelling.  Gastrointestinal: Negative for abdominal pain, nausea and vomiting.  Neurological: Positive for dizziness (vertigo related to allergies). Negative for tingling.  Endo/Heme/Allergies: Positive for environmental allergies.  All other systems reviewed and are negative. per hpi   OBJECTIVE:  Today's Vitals   03/14/19 1433  BP: 124/80  Pulse: 88  Temp: 98.7 F (37.1 C)  TempSrc: Oral  SpO2: 96%  Weight: 161 lb (73 kg)  Height: 5' 7" (1.702 m)   Body mass index is 25.22 kg/m.   Physical Exam Vitals signs and nursing note reviewed. Exam conducted with a chaperone present.  Constitutional:      Appearance: She is well-developed.  HENT:     Head: Normocephalic and atraumatic.     Right Ear: Hearing, tympanic membrane, ear canal and external ear normal.     Left Ear: Hearing, tympanic membrane, ear canal and external ear normal.  Eyes:     Conjunctiva/sclera: Conjunctivae normal.     Pupils: Pupils are equal, round, and reactive to light.  Neck:     Musculoskeletal: Neck supple.  Thyroid: No thyromegaly.  Cardiovascular:     Rate and Rhythm: Normal rate and regular rhythm.     Pulses: Normal pulses.     Heart sounds: Normal heart sounds. No murmur. No friction rub. No gallop.   Pulmonary:     Effort: Pulmonary effort is normal.     Breath sounds: Normal breath sounds. No wheezing, rhonchi or rales.  Chest:     Breasts: Breasts are symmetrical.        Right: No inverted nipple, mass, nipple discharge, skin change or tenderness.        Left: No inverted nipple, mass, nipple discharge, skin change or tenderness.  Abdominal:      General: Bowel sounds are normal. There is no distension.     Palpations: Abdomen is soft.     Tenderness: There is no abdominal tenderness. There is no guarding.  Genitourinary:    Labia:        Right: No rash or lesion.        Left: No rash or lesion.      Vagina: No vaginal discharge or erythema.     Cervix: No cervical motion tenderness, discharge or friability.     Uterus: Not enlarged, not fixed and not tender.      Adnexa:        Right: No mass or tenderness.         Left: No mass or tenderness.    Musculoskeletal: Normal range of motion.  Lymphadenopathy:     Cervical: No cervical adenopathy.     Upper Body:     Right upper body: No supraclavicular or axillary adenopathy.     Left upper body: No supraclavicular or axillary adenopathy.  Skin:    General: Skin is warm and dry.  Neurological:     Mental Status: She is alert and oriented to person, place, and time.     Deep Tendon Reflexes: Reflexes are normal and symmetric.     ASSESSMENT and PLAN  1. Women's annual routine gynecological examination No concerns per history or exam. Routine HCM labs ordered. HCM reviewed/discussed. Patient turns 48 in 3 days. Anticipatory guidance regarding healthy weight, lifestyle and choices given.  Recommended shingrix at pharmacy of choice. - Cytology - PAP([place])  2. Screening for endocrine, nutritional, metabolic and immunity disorder - CMP14+EGFR  3. Screening for diabetes mellitus - Hemoglobin A1c  4. Screening for thyroid disorder - TSH  5. Screening for lipid disorders - Lipid panel  6. Screening for colon cancer - Cologuard  7. Visit for screening mammogram - MM DIGITAL SCREENING BILATERAL; Future  Return in about 1 year (around 03/13/2020).    Rutherford Guys, MD Primary Care at [facility] [place], [place] [address] Ph.  [phone] Fax [phone]

## 2019-03-14 NOTE — Patient Instructions (Addendum)
Samantha Floyd does provide shingrix vaccines just not currently due to covid 19. You do not need a prescription for this.     Preventive Care 40-64 Years, Female Preventive care refers to lifestyle choices and visits with your health care provider that can promote health and wellness. What does preventive care include?   A yearly physical exam. This is also called an annual well check.  Dental exams once or twice a year.  Routine eye exams. Ask your health care provider how often you should have your eyes checked.  Personal lifestyle choices, including: ? Daily care of your teeth and gums. ? Regular physical activity. ? Eating a healthy diet. ? Avoiding tobacco and drug use. ? Limiting alcohol use. ? Practicing safe sex. ? Taking low-dose aspirin daily starting at age 26. ? Taking vitamin and mineral supplements as recommended by your health care provider. What happens during an annual well check? The services and screenings done by your health care provider during your annual well check will depend on your age, overall health, lifestyle risk factors, and family history of disease. Counseling Your health care provider may ask you questions about your:  Alcohol use.  Tobacco use.  Drug use.  Emotional well-being.  Home and relationship well-being.  Sexual activity.  Eating habits.  Work and work Statistician.  Method of birth control.  Menstrual cycle.  Pregnancy history. Screening You may have the following tests or measurements:  Height, weight, and BMI.  Blood pressure.  Lipid and cholesterol levels. These may be checked every 5 years, or more frequently if you are over 61 years old.  Skin check.  Lung cancer screening. You may have this screening every year starting at age 2 if you have a 30-pack-year history of smoking and currently smoke or have quit within the past 15 years.  Colorectal cancer screening. All adults should have this screening  starting at age 3 and continuing until age 11. Your health care provider may recommend screening at age 31. You will have tests every 1-10 years, depending on your results and the type of screening test. People at increased risk should start screening at an earlier age. Screening tests may include: ? Guaiac-based fecal occult blood testing. ? Fecal immunochemical test (FIT). ? Stool DNA test. ? Virtual colonoscopy. ? Sigmoidoscopy. During this test, a flexible tube with a tiny camera (sigmoidoscope) is used to examine your rectum and lower colon. The sigmoidoscope is inserted through your anus into your rectum and lower colon. ? Colonoscopy. During this test, a long, thin, flexible tube with a tiny camera (colonoscope) is used to examine your entire colon and rectum.  Hepatitis C blood test.  Hepatitis B blood test.  Sexually transmitted disease (STD) testing.  Diabetes screening. This is done by checking your blood sugar (glucose) after you have not eaten for a while (fasting). You may have this done every 1-3 years.  Mammogram. This may be done every 1-2 years. Talk to your health care provider about when you should start having regular mammograms. This may depend on whether you have a family history of breast cancer.  BRCA-related cancer screening. This may be done if you have a family history of breast, ovarian, tubal, or peritoneal cancers.  Pelvic exam and Pap test. This may be done every 3 years starting at age 19. Starting at age 80, this may be done every 5 years if you have a Pap test in combination with an HPV test.  Bone density scan. This  is done to screen for osteoporosis. You may have this scan if you are at high risk for osteoporosis. Discuss your test results, treatment options, and if necessary, the need for more tests with your health care provider. Vaccines Your health care provider may recommend certain vaccines, such as:  Influenza vaccine. This is recommended every  year.  Tetanus, diphtheria, and acellular pertussis (Tdap, Td) vaccine. You may need a Td booster every 10 years.  Varicella vaccine. You may need this if you have not been vaccinated.  Zoster vaccine. You may need this after age 22.  Measles, mumps, and rubella (MMR) vaccine. You may need at least one dose of MMR if you were born in 1957 or later. You may also need a second dose.  Pneumococcal 13-valent conjugate (PCV13) vaccine. You may need this if you have certain conditions and were not previously vaccinated.  Pneumococcal polysaccharide (PPSV23) vaccine. You may need one or two doses if you smoke cigarettes or if you have certain conditions.  Meningococcal vaccine. You may need this if you have certain conditions.  Hepatitis A vaccine. You may need this if you have certain conditions or if you travel or work in places where you may be exposed to hepatitis A.  Hepatitis B vaccine. You may need this if you have certain conditions or if you travel or work in places where you may be exposed to hepatitis B.  Haemophilus influenzae type b (Hib) vaccine. You may need this if you have certain conditions. Talk to your health care provider about which screenings and vaccines you need and how often you need them. This information is not intended to replace advice given to you by your health care provider. Make sure you discuss any questions you have with your health care provider. Document Released: 11/12/2015 Document Revised: 12/06/2017 Document Reviewed: 08/17/2015 Elsevier Interactive Patient Education  Samantha Floyd.    If you have lab work done today you will be contacted with your lab results within the next 2 weeks.  If you have not heard from [place] then please contact us. The fastest way to get your results is to register for My Chart.   IF you received an x-ray today, you will receive an invoice from [facility]. Please contact [facility] at [phone] with  questions or concerns regarding your invoice.   IF you received labwork today, you will receive an invoice from [facility]. Please contact [facility] at [phone] with questions or concerns regarding your invoice.   Our billing staff will not be able to assist you with questions regarding bills from these companies.  You will be contacted with the lab results as soon as they are available. The fastest way to get your results is to activate your My Chart account. Instructions are located on the last page of this paperwork. If you have not heard from [place] regarding the results in 2 weeks, please contact this office.

## 2019-03-15 LAB — HEMOGLOBIN A1C
Est. average glucose Bld gHb Est-mCnc: 111 mg/dL
Hgb A1c MFr Bld: 5.5 % (ref 4.8–5.6)

## 2019-03-15 LAB — CMP14+EGFR
ALT: 19 IU/L (ref 0–32)
AST: 21 IU/L (ref 0–40)
Albumin/Globulin Ratio: 1.9 (ref 1.2–2.2)
Albumin: 4.4 g/dL (ref 3.8–4.8)
Alkaline Phosphatase: 95 IU/L (ref 39–117)
BUN/Creatinine Ratio: 20 (ref 9–23)
BUN: 16 mg/dL (ref 6–24)
Bilirubin Total: <0.2 mg/dL (ref 0.0–1.2)
CO2: 23 mmol/L (ref 20–29)
Calcium: 9.7 mg/dL (ref 8.7–10.2)
Chloride: 104 mmol/L (ref 96–106)
Creatinine, Ser: 0.82 mg/dL (ref 0.57–1.00)
GFR calc Af Amer: 97 mL/min/{1.73_m2} (ref >59)
GFR calc non Af Amer: 84 mL/min/{1.73_m2} (ref >59)
Globulin, Total: 2.3 g/dL (ref 1.5–4.5)
Glucose: 85 mg/dL (ref 65–99)
Potassium: 4.1 mmol/L (ref 3.5–5.2)
Sodium: 140 mmol/L (ref 134–144)
Total Protein: 6.7 g/dL (ref 6.0–8.5)

## 2019-03-15 LAB — LIPID PANEL
Chol/HDL Ratio: 3.8 ratio (ref 0.0–4.4)
Cholesterol, Total: 218 mg/dL — ABNORMAL HIGH (ref 100–199)
HDL: 57 mg/dL (ref >39)
LDL Calculated: 135 mg/dL — ABNORMAL HIGH (ref 0–99)
Triglycerides: 129 mg/dL (ref 0–149)
VLDL Cholesterol Cal: 26 mg/dL (ref 5–40)

## 2019-03-15 LAB — TSH: TSH: 5.43 u[IU]/mL — ABNORMAL HIGH (ref 0.450–4.500)

## 2019-03-19 NOTE — Addendum Note (Signed)
Addended by: Rutherford Guys on: 03/19/2019 12:21 PM   Modules accepted: Orders

## 2019-03-21 ENCOUNTER — Other Ambulatory Visit: Payer: Self-pay | Admitting: Family Medicine

## 2019-03-21 LAB — CYTOLOGY - PAP: Diagnosis: NEGATIVE

## 2019-03-21 LAB — HM PAP SMEAR: HM Pap smear: NEGATIVE

## 2019-03-21 NOTE — Progress Notes (Signed)
SATISFACTORY FOR EVALUATION, ENDOCERVICAL/TRANSFORMATION ZONE COMPONENT PRESENT  NEG FOR INTRAEPITHELIAL LESION OR MALIGNANCY

## 2019-04-08 NOTE — Telephone Encounter (Signed)
done

## 2019-05-06 ENCOUNTER — Ambulatory Visit: Payer: 59

## 2019-06-18 ENCOUNTER — Ambulatory Visit
Admission: RE | Admit: 2019-06-18 | Discharge: 2019-06-18 | Disposition: A | Discharge status: Home / Self Care | Payer: 59 | Source: Ambulatory Visit | Attending: Family Medicine | Admitting: Family Medicine

## 2019-06-18 ENCOUNTER — Other Ambulatory Visit: Payer: Self-pay

## 2019-06-18 ENCOUNTER — Other Ambulatory Visit: Payer: Self-pay | Admitting: Family Medicine

## 2019-06-18 ENCOUNTER — Ambulatory Visit: Payer: 59

## 2019-06-18 DIAGNOSIS — Z1231 Encounter for screening mammogram for malignant neoplasm of breast: Secondary | ICD-10-CM | POA: Diagnosis not present

## 2019-06-18 DIAGNOSIS — R946 Abnormal results of thyroid function studies: Secondary | ICD-10-CM

## 2019-06-19 LAB — T4, FREE: Free T4: 1.01 ng/dL (ref 0.82–1.77)

## 2019-06-20 ENCOUNTER — Other Ambulatory Visit: Payer: Self-pay | Admitting: Family Medicine

## 2019-06-20 DIAGNOSIS — R928 Other abnormal and inconclusive findings on diagnostic imaging of breast: Secondary | ICD-10-CM

## 2019-06-27 ENCOUNTER — Ambulatory Visit
Admission: RE | Admit: 2019-06-27 | Discharge: 2019-06-27 | Disposition: A | Discharge status: Home / Self Care | Payer: 59 | Source: Ambulatory Visit | Attending: Family Medicine | Admitting: Family Medicine

## 2019-06-27 ENCOUNTER — Other Ambulatory Visit: Payer: Self-pay

## 2019-06-27 ENCOUNTER — Ambulatory Visit: Payer: 59

## 2019-06-27 DIAGNOSIS — R928 Other abnormal and inconclusive findings on diagnostic imaging of breast: Secondary | ICD-10-CM | POA: Diagnosis not present

## 2019-06-27 DIAGNOSIS — N6001 Solitary cyst of right breast: Secondary | ICD-10-CM | POA: Diagnosis not present

## 2019-08-02 DIAGNOSIS — N951 Menopausal and female climacteric states: Secondary | ICD-10-CM | POA: Insufficient documentation

## 2019-12-09 DIAGNOSIS — H524 Presbyopia: Secondary | ICD-10-CM | POA: Diagnosis not present

## 2019-12-24 ENCOUNTER — Ambulatory Visit: Payer: 59 | Admitting: Registered Nurse

## 2020-06-25 ENCOUNTER — Other Ambulatory Visit (HOSPITAL_COMMUNITY): Payer: Self-pay | Admitting: Internal Medicine

## 2020-06-25 MED FILL — SHINGRIX 50 MCG SUS: 50 | 1 days supply | Qty: 1 | Fill #0

## 2020-07-20 ENCOUNTER — Other Ambulatory Visit: Payer: Self-pay

## 2020-07-20 ENCOUNTER — Ambulatory Visit (INDEPENDENT_AMBULATORY_CARE_PROVIDER_SITE_OTHER): Payer: 59 | Admitting: Family Medicine

## 2020-07-20 DIAGNOSIS — Z13220 Encounter for screening for lipoid disorders: Secondary | ICD-10-CM | POA: Diagnosis not present

## 2020-07-20 DIAGNOSIS — Z13228 Encounter for screening for other metabolic disorders: Secondary | ICD-10-CM

## 2020-07-20 DIAGNOSIS — R946 Abnormal results of thyroid function studies: Secondary | ICD-10-CM | POA: Diagnosis not present

## 2020-07-20 DIAGNOSIS — Z131 Encounter for screening for diabetes mellitus: Secondary | ICD-10-CM

## 2020-07-20 DIAGNOSIS — Z1329 Encounter for screening for other suspected endocrine disorder: Secondary | ICD-10-CM | POA: Diagnosis not present

## 2020-07-20 DIAGNOSIS — Z1321 Encounter for screening for nutritional disorder: Secondary | ICD-10-CM

## 2020-07-20 DIAGNOSIS — Z130 Encounter for screening for diseases of the blood and blood-forming organs and certain disorders involving the immune mechanism: Secondary | ICD-10-CM

## 2020-07-21 LAB — CMP14+EGFR
ALT: 17 IU/L (ref 0–32)
AST: 23 IU/L (ref 0–40)
Albumin/Globulin Ratio: 1.8 (ref 1.2–2.2)
Albumin: 4.4 g/dL (ref 3.8–4.9)
Alkaline Phosphatase: 110 IU/L (ref 44–121)
BUN/Creatinine Ratio: 25 — ABNORMAL HIGH (ref 9–23)
BUN: 20 mg/dL (ref 6–24)
Bilirubin Total: 0.2 mg/dL (ref 0.0–1.2)
CO2: 21 mmol/L (ref 20–29)
Calcium: 9.6 mg/dL (ref 8.7–10.2)
Chloride: 104 mmol/L (ref 96–106)
Creatinine, Ser: 0.81 mg/dL (ref 0.57–1.00)
GFR calc Af Amer: 97 mL/min/{1.73_m2} (ref >59)
GFR calc non Af Amer: 84 mL/min/{1.73_m2} (ref >59)
Globulin, Total: 2.5 g/dL (ref 1.5–4.5)
Glucose: 93 mg/dL (ref 65–99)
Potassium: 4.4 mmol/L (ref 3.5–5.2)
Sodium: 140 mmol/L (ref 134–144)
Total Protein: 6.9 g/dL (ref 6.0–8.5)

## 2020-07-21 LAB — LIPID PANEL
Chol/HDL Ratio: 3.9 ratio (ref 0.0–4.4)
Cholesterol, Total: 211 mg/dL — ABNORMAL HIGH (ref 100–199)
HDL: 54 mg/dL (ref >39)
LDL Chol Calc (NIH): 136 mg/dL — ABNORMAL HIGH (ref 0–99)
Triglycerides: 119 mg/dL (ref 0–149)
VLDL Cholesterol Cal: 21 mg/dL (ref 5–40)

## 2020-07-21 LAB — HEMOGLOBIN A1C
Est. average glucose Bld gHb Est-mCnc: 120 mg/dL
Hgb A1c MFr Bld: 5.8 % — ABNORMAL HIGH (ref 4.8–5.6)

## 2020-07-21 LAB — T4, FREE: Free T4: 1.05 ng/dL (ref 0.82–1.77)

## 2020-07-21 LAB — TSH: TSH: 4.49 u[IU]/mL (ref 0.450–4.500)

## 2020-08-02 ENCOUNTER — Other Ambulatory Visit: Payer: Self-pay | Admitting: Family Medicine

## 2020-08-02 DIAGNOSIS — Z1231 Encounter for screening mammogram for malignant neoplasm of breast: Secondary | ICD-10-CM

## 2020-08-05 ENCOUNTER — Ambulatory Visit (INDEPENDENT_AMBULATORY_CARE_PROVIDER_SITE_OTHER): Payer: 59 | Admitting: Family Medicine

## 2020-08-05 ENCOUNTER — Encounter: Payer: Self-pay | Admitting: Family Medicine

## 2020-08-05 ENCOUNTER — Other Ambulatory Visit: Payer: Self-pay

## 2020-08-05 VITALS — BP 123/83 | HR 78 | Temp 97.8°F | Resp 15 | Ht 67.0 in | Wt 155.4 lb

## 2020-08-05 DIAGNOSIS — Z0001 Encounter for general adult medical examination with abnormal findings: Secondary | ICD-10-CM

## 2020-08-05 DIAGNOSIS — R232 Flushing: Secondary | ICD-10-CM | POA: Diagnosis not present

## 2020-08-05 DIAGNOSIS — H6592 Unspecified nonsuppurative otitis media, left ear: Secondary | ICD-10-CM | POA: Diagnosis not present

## 2020-08-05 DIAGNOSIS — R7303 Prediabetes: Secondary | ICD-10-CM | POA: Diagnosis not present

## 2020-08-05 DIAGNOSIS — Z0000 Encounter for general adult medical examination without abnormal findings: Secondary | ICD-10-CM

## 2020-08-05 NOTE — Patient Instructions (Signed)
° ° ° °  If you have lab work done today you will be contacted with your lab results within the next 2 weeks.  If you have not heard from us then please contact us. The fastest way to get your results is to register for My Chart. ° ° °IF you received an x-ray today, you will receive an invoice from [facility]. Please contact [facility] at [phone] with questions or concerns regarding your invoice.  ° °IF you received labwork today, you will receive an invoice from [facility]. Please contact [facility] at [phone] with questions or concerns regarding your invoice.  ° °Our billing staff will not be able to assist you with questions regarding bills from these companies. ° °You will be contacted with the lab results as soon as they are available. The fastest way to get your results is to activate your My Chart account. Instructions are located on the last page of this paperwork. If you have not heard from us regarding the results in 2 weeks, please contact this office. °  ° ° ° °

## 2020-08-05 NOTE — Progress Notes (Signed)
10/7/20214:22 PM  Samantha Floyd 07/30/1971, 49 y.o., female [id number]  Chief Complaint  Patient presents with  . Annual Exam    pt concerned for cholesterole level on labs, check on vaccines, pt also been getting hot flashes and they have been waking her up hasnt had consistent period over the last year     HPI:   Patient is a 49 y.o. female who presents today for CPE  Health Maintenance  Topic Date Due  . COLONOSCOPY  08/05/2021 (Originally 03/17/2019)  . Hepatitis C Screening  08/05/2021 (Originally 1970/11/04)  . HIV Screening  08/05/2021 (Originally 03/16/1984)  . MAMMOGRAM  06/26/2021  . PAP SMEAR-Modifier  03/13/2022  . TETANUS/TDAP  10/07/2025  . INFLUENZA VACCINE  Completed  . COVID-19 Vaccine  Completed   There are no preventive care reminders to display for this patient.  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 0 0 0 No  Number falls in past yr: 0 0 0 -  Injury with Fall? 0 0 0 -  Risk for fall due to : No Fall Risks - - -  Follow up Falls evaluation completed - - -     No Known Allergies  Prior to Admission medications   Not on File    History reviewed. No pertinent past medical history.  Past Surgical History:  Procedure Laterality Date  . ADENOIDECTOMY  10/30/1974    Social History   Tobacco Use  . Smoking status: Never Smoker  . Smokeless tobacco: Never Used  Substance Use Topics    . Alcohol use: No    Alcohol/week: 0.0 standard drinks    Family History  Problem Relation Age of Onset  . Hypertension Mother   . Cancer Father   . Hypertension Father   . Heart disease Maternal Grandmother   . Cancer Maternal Grandfather   . Stroke Maternal Grandfather   . Heart disease Paternal Grandmother   . Hyperlipidemia Paternal Grandmother   . Hypertension Paternal Grandmother   . Stroke Paternal Grandmother   . Diabetes Paternal Grandfather   . Heart disease Paternal Grandfather   . Hypertension Paternal Grandfather     Review of Systems  Constitutional: Negative for chills, diaphoresis, fever and malaise/fatigue.  HENT: Positive for congestion. Negative for ear discharge and hearing loss.   Eyes: Negative for blurred vision and double vision.  Respiratory: Negative for cough and shortness of breath.   Cardiovascular: Negative for chest pain, palpitations and leg swelling.  Gastrointestinal: Negative for abdominal pain, blood in stool, constipation, diarrhea, melena, nausea and vomiting.  Genitourinary: Negative for dysuria, frequency, hematuria and urgency.  Musculoskeletal: Negative for joint pain and myalgias.  Neurological: Negative for dizziness, tingling, focal weakness and headaches.  Endo/Heme/Allergies: Negative for polydipsia. Does not bruise/bleed easily.  Psychiatric/Behavioral: Negative for depression. The patient is not nervous/anxious and does not have  insomnia.      OBJECTIVE:  Today's Vitals   08/05/20 1552  BP: 123/83  Pulse: 78  Resp: 15  Temp: 97.8 F (36.6 C)  TempSrc: Temporal  SpO2: 99%  Weight: 155 lb 6.4 oz (70.5 kg)  Height: 5' 7"  (1.702 m)   Body mass index is 24.34 kg/m.   Physical Exam Vitals and nursing note reviewed.  Constitutional:      Appearance: She is well-developed.  HENT:     Head: Normocephalic and atraumatic.     Right Ear: Hearing, tympanic membrane, ear canal and external ear normal.     Left Ear:  Hearing, ear canal and external ear normal. A middle ear effusion is present. Tympanic membrane is not erythematous or bulging.     Mouth/Throat:     Mouth: Mucous membranes are moist.     Pharynx: No oropharyngeal exudate or posterior oropharyngeal erythema.  Eyes:     Extraocular Movements: Extraocular movements intact.     Conjunctiva/sclera: Conjunctivae normal.     Pupils: Pupils are equal, round, and reactive to light.  Neck:     Thyroid: No thyromegaly.  Cardiovascular:     Rate and Rhythm: Normal rate and regular rhythm.     Heart sounds: Normal heart sounds. No murmur heard.  No friction rub. No gallop.   Pulmonary:     Effort: Pulmonary effort is normal.     Breath sounds: Normal breath sounds. No wheezing, rhonchi or rales.  Abdominal:     General: Bowel sounds are normal. There is no distension.     Palpations: Abdomen is soft. There is no hepatomegaly, splenomegaly or mass.     Tenderness: There is no abdominal tenderness.  Musculoskeletal:        General: Normal range of motion.     Cervical back: Neck supple.     Right lower leg: No edema.     Left lower leg: No edema.  Lymphadenopathy:     Cervical: No cervical adenopathy.  Skin:    General: Skin is warm and dry.  Neurological:     Mental Status: She is alert and oriented to person, place, and time.     Cranial Nerves: No cranial nerve deficit.     Gait: Gait normal.     Deep Tendon Reflexes: Reflexes are normal and symmetric.  Psychiatric:        Mood and Affect: Mood normal.        Behavior: Behavior normal.     No results found for this or any previous visit (from the past 24 hour(s)).  No results found.   ASSESSMENT and PLAN  1. Annual physical exam No concerns per history or exam. Routine HCM labs ordered. HCM reviewed/discussed. Anticipatory guidance regarding healthy weight, lifestyle and choices given. Patient instructed to call cologuard to request new kit  2. Prediabetes Discussed  LFM  3. Fluid level behind tympanic membrane of left ear No infection. Discussed flonase  4. Hot flashes Discussed treatment options, patient will cont with nighttime benadryl that helps with sleep thru the hot flashes and her allergies  Return in about 1 year (around 08/05/2021).    Rutherford Guys, MD Primary Care at [facility] [place], [place] [address] Ph.  [phone] Fax [phone]

## 2020-08-16 ENCOUNTER — Ambulatory Visit
Admission: RE | Admit: 2020-08-16 | Discharge: 2020-08-16 | Disposition: A | Discharge status: Home / Self Care | Payer: 59 | Source: Ambulatory Visit | Attending: Family Medicine | Admitting: Family Medicine

## 2020-08-16 ENCOUNTER — Other Ambulatory Visit: Payer: Self-pay

## 2020-08-16 DIAGNOSIS — Z1231 Encounter for screening mammogram for malignant neoplasm of breast: Secondary | ICD-10-CM

## 2020-09-30 MED FILL — SHINGRIX 50 MCG SUS: 50 | 1 days supply | Qty: 1 | Fill #1

## 2020-11-30 DIAGNOSIS — H524 Presbyopia: Secondary | ICD-10-CM | POA: Diagnosis not present

## 2021-06-03 ENCOUNTER — Other Ambulatory Visit (HOSPITAL_COMMUNITY): Payer: Self-pay

## 2021-06-03 MED ORDER — AZITHROMYCIN 500 MG PO TABS
ORAL_TABLET | ORAL | 0 refills | Status: DC
  Filled 2021-06-03: qty 4, 3d supply, fill #0

## 2021-08-05 IMAGING — MG DIGITAL SCREENING BILAT W/ TOMO W/ CAD
8 series · 9 of 24 positions shown · non-contrast
Comparison: Previous exam(s).

CLINICAL DATA: Screening.

EXAM:
DIGITAL SCREENING BILATERAL MAMMOGRAM WITH TOMO AND CAD

[L CC synth-2D]
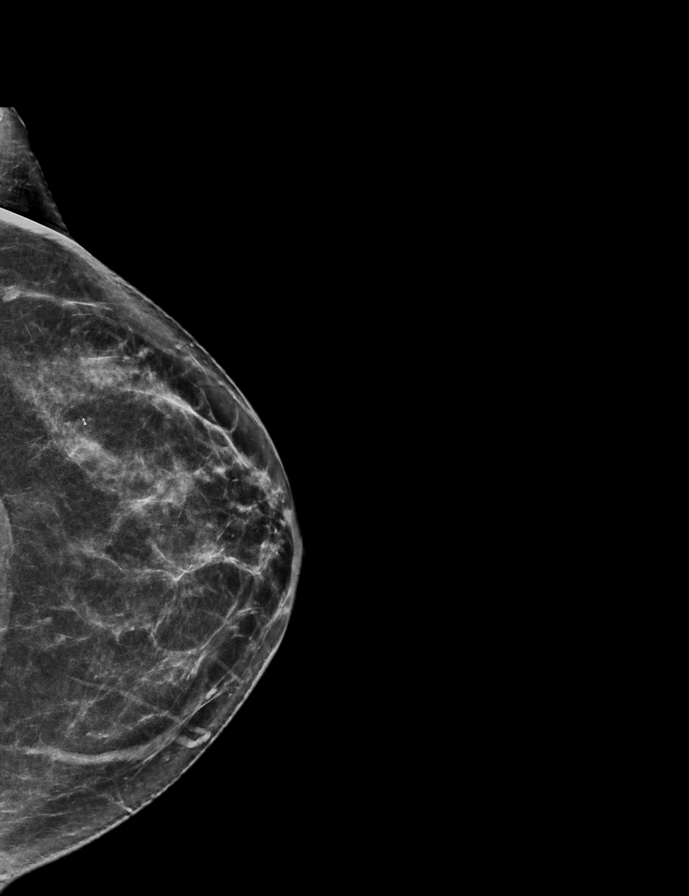

[R CC synth-2D]
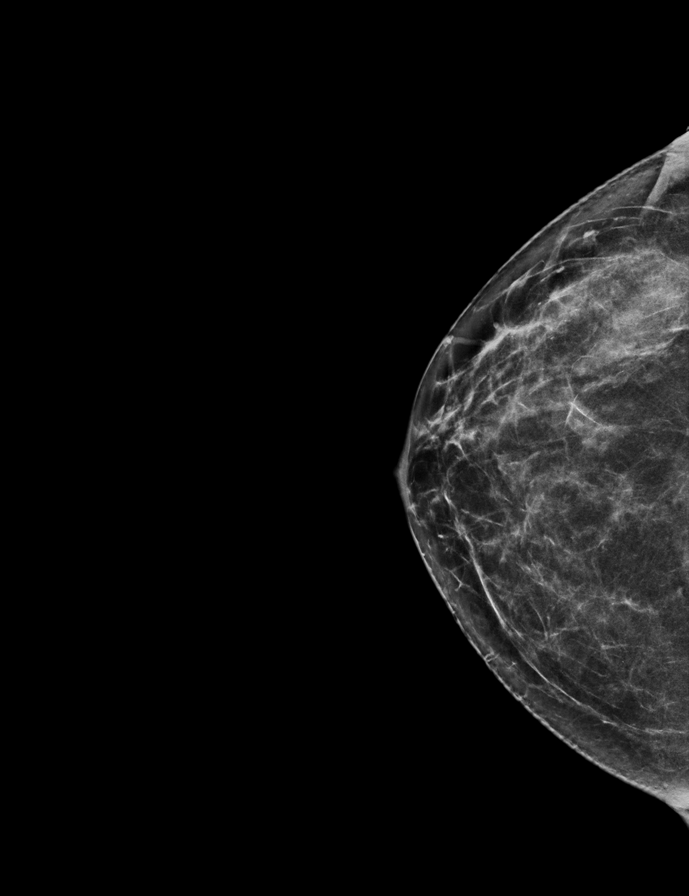

[L MLO synth-2D]
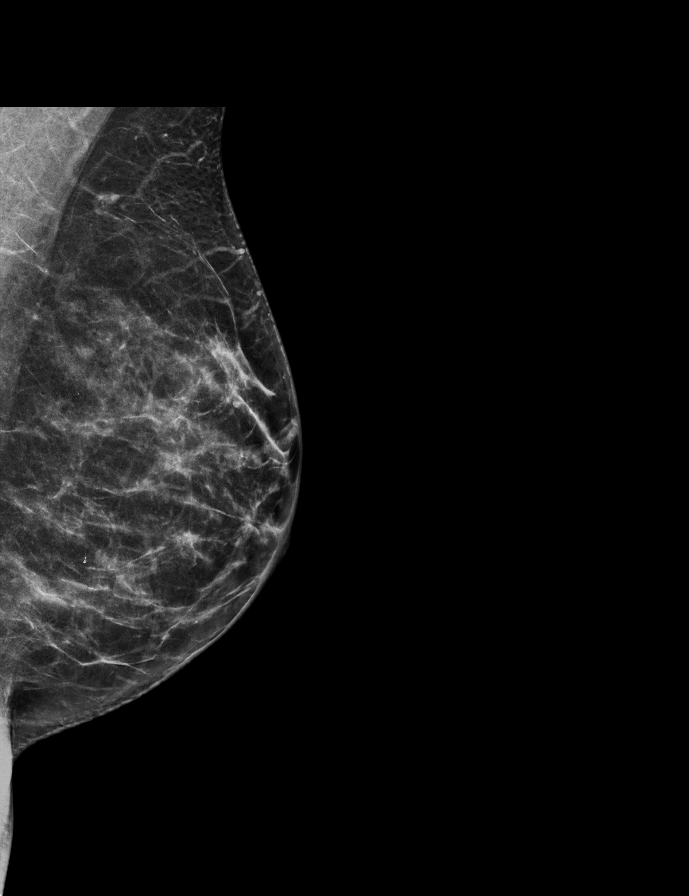

[R MLO synth-2D]
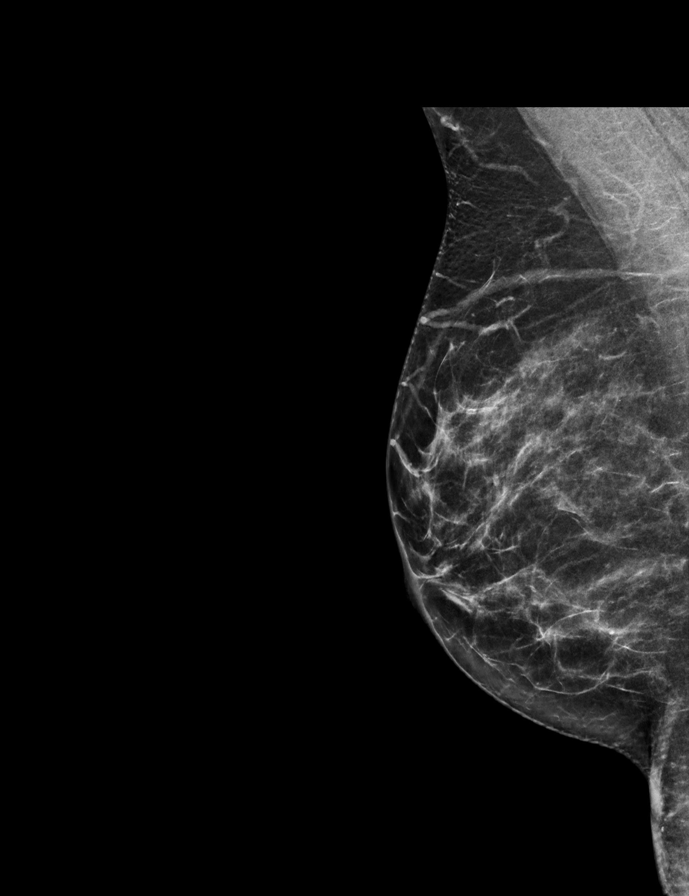

[L MLO tomo · 2 of 60 frames shown]
[frame 20/60]
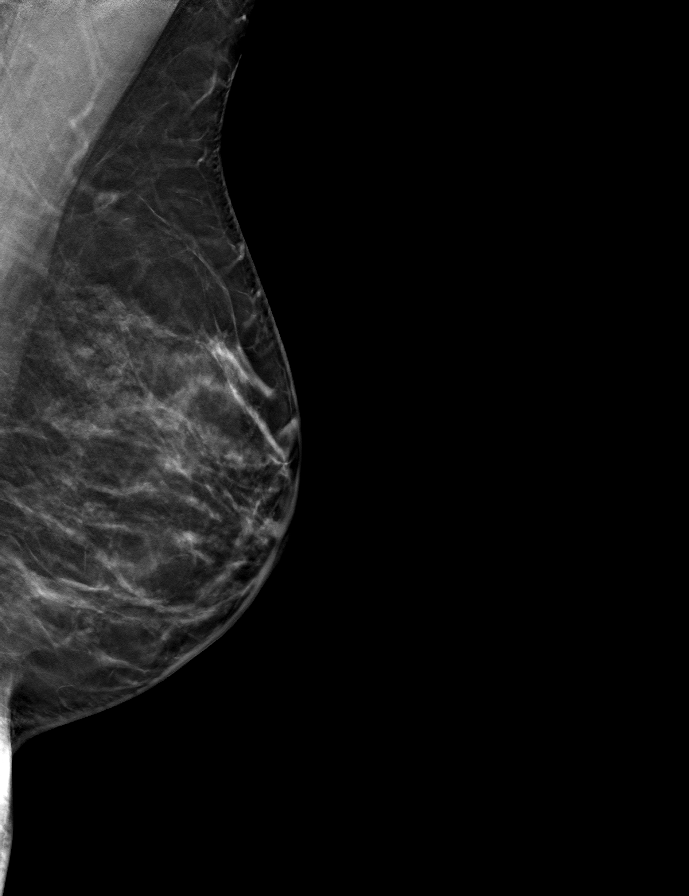
[frame 31/60]
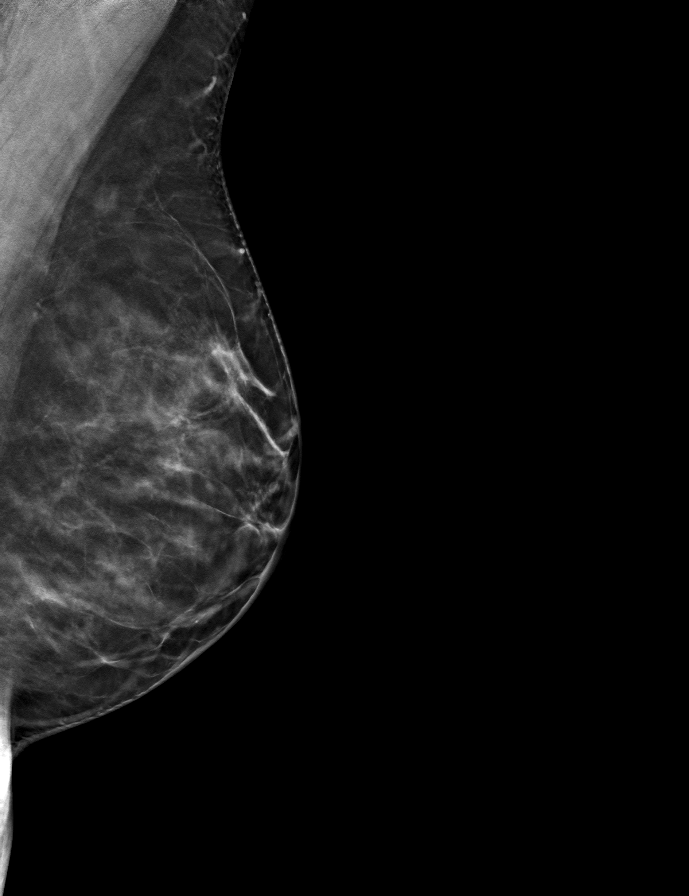

[R CC tomo · tomo slice 31/61.0]
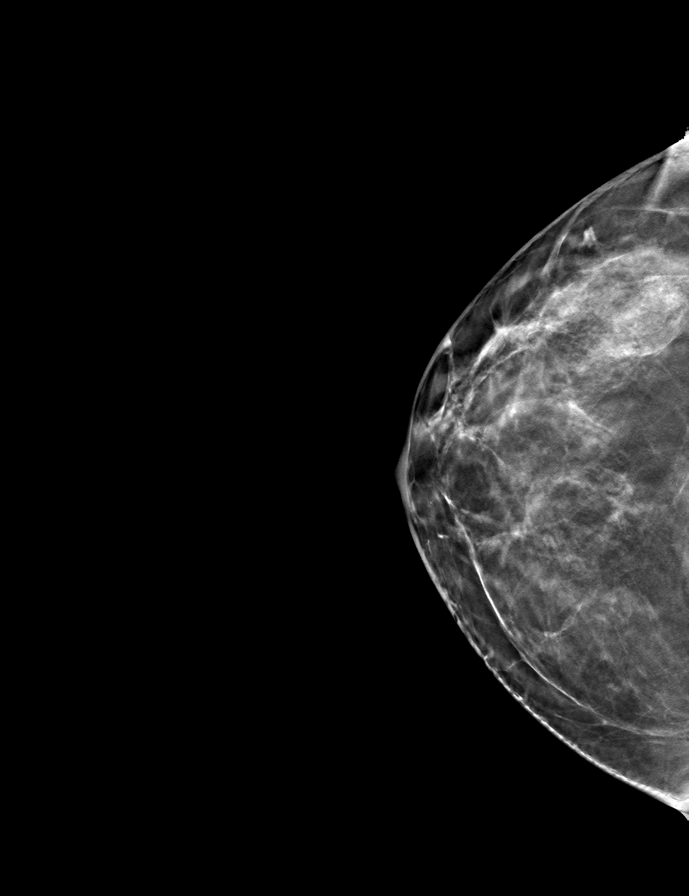

[L CC tomo · tomo slice 31/62.0]
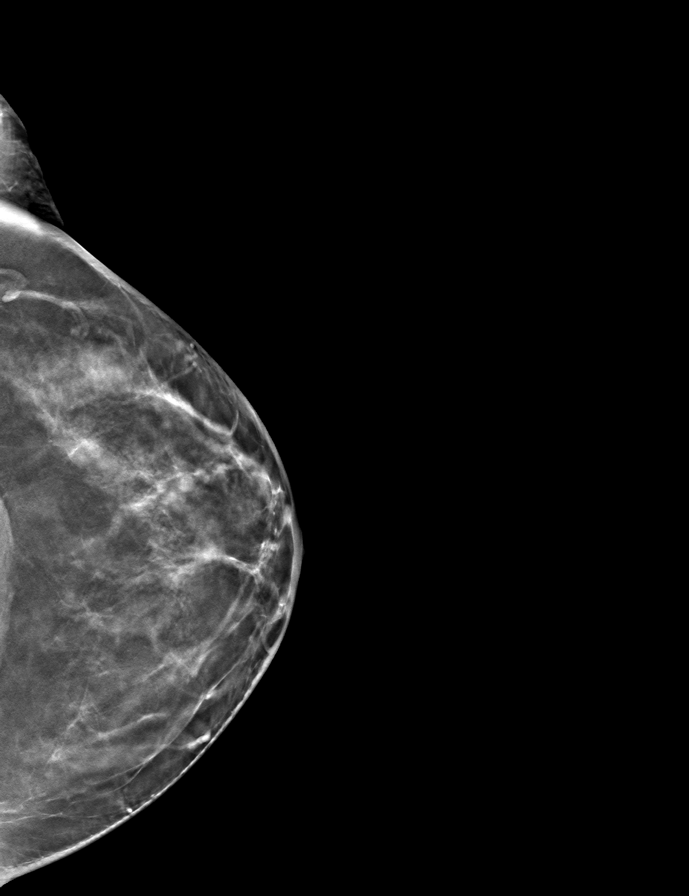

[R MLO tomo · tomo slice 31/60.0]
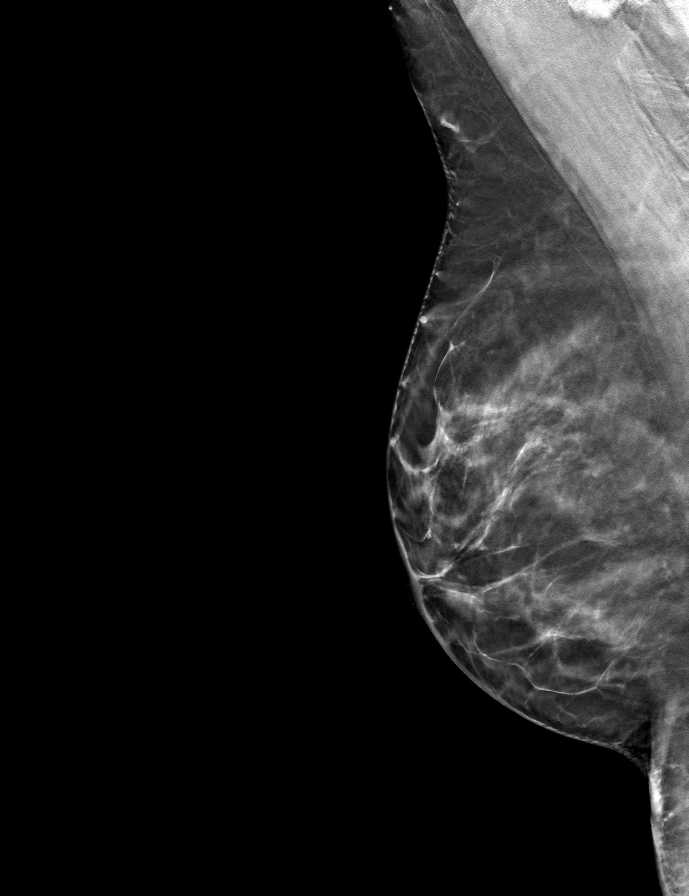

[9 of 24 positions shown; findings below may reference images not displayed]

ACR Breast Density Category c: The breast tissue is heterogeneously
dense, which may obscure small masses.
FINDINGS: There are no findings suspicious for malignancy. Images were
processed with CAD.
IMPRESSION: No mammographic evidence of malignancy. A result letter of this
screening mammogram will be mailed directly to the patient.

RECOMMENDATION:
Screening mammogram in one year. (Code:FT-U-LHB)

BI-RADS CATEGORY  1: Negative.

## 2021-09-05 ENCOUNTER — Other Ambulatory Visit: Payer: Self-pay | Admitting: Family Medicine

## 2021-09-05 DIAGNOSIS — Z1231 Encounter for screening mammogram for malignant neoplasm of breast: Secondary | ICD-10-CM

## 2021-09-08 ENCOUNTER — Ambulatory Visit
Admission: RE | Admit: 2021-09-08 | Discharge: 2021-09-08 | Disposition: A | Discharge status: Home / Self Care | Payer: 59 | Source: Ambulatory Visit | Attending: Family Medicine | Admitting: Family Medicine

## 2021-09-08 DIAGNOSIS — Z1231 Encounter for screening mammogram for malignant neoplasm of breast: Secondary | ICD-10-CM | POA: Diagnosis not present

## 2021-09-12 ENCOUNTER — Other Ambulatory Visit: Payer: Self-pay | Admitting: Family Medicine

## 2021-09-13 ENCOUNTER — Other Ambulatory Visit: Payer: Self-pay | Admitting: Family Medicine

## 2021-09-13 DIAGNOSIS — R928 Other abnormal and inconclusive findings on diagnostic imaging of breast: Secondary | ICD-10-CM

## 2021-09-27 ENCOUNTER — Encounter: Payer: Self-pay | Admitting: Nurse Practitioner

## 2021-09-27 ENCOUNTER — Ambulatory Visit (INDEPENDENT_AMBULATORY_CARE_PROVIDER_SITE_OTHER): Payer: 59 | Admitting: Nurse Practitioner

## 2021-09-27 ENCOUNTER — Other Ambulatory Visit: Payer: Self-pay

## 2021-09-27 VITALS — BP 120/77 | HR 83 | Temp 98.4°F | Ht 67.0 in | Wt 154.9 lb

## 2021-09-27 DIAGNOSIS — D509 Iron deficiency anemia, unspecified: Secondary | ICD-10-CM

## 2021-09-27 DIAGNOSIS — Z0000 Encounter for general adult medical examination without abnormal findings: Secondary | ICD-10-CM

## 2021-09-27 DIAGNOSIS — E785 Hyperlipidemia, unspecified: Secondary | ICD-10-CM | POA: Diagnosis not present

## 2021-09-27 DIAGNOSIS — Z1211 Encounter for screening for malignant neoplasm of colon: Secondary | ICD-10-CM | POA: Diagnosis not present

## 2021-09-27 DIAGNOSIS — Z7689 Persons encountering health services in other specified circumstances: Secondary | ICD-10-CM

## 2021-09-27 DIAGNOSIS — R7301 Impaired fasting glucose: Secondary | ICD-10-CM | POA: Diagnosis not present

## 2021-09-27 DIAGNOSIS — R5383 Other fatigue: Secondary | ICD-10-CM | POA: Insufficient documentation

## 2021-09-27 NOTE — Patient Instructions (Signed)
Colorectal Cancer Colorectal cancer is a cancerous (malignant) tumor in the colon or rectum, which are parts of the large intestine. A tumor is a mass of cells or tissue. The cancer can spread (metastasize) to other parts of the body. What are the causes? This condition is usually caused by abnormal growths called polyps on the inner wall of the colon or rectum. Left untreated, these polyps can develop into cancer. Other times, abnormal changes to genes (gene mutations) can cause cells to become cancerous. What increases the risk? The following factors may make you more likely to develop this condition: Being older than age 87. Having a personal or family history of colorectal cancer or polyps in your colon. Having diabetes, or having had cancer and cancer treatments such as radiation before. Having certain hereditary conditions, such as: Lynch syndrome. Familial adenomatous polyposis. Turcot syndrome. Peutz-Jeghers syndrome. MUTYH-associated polyposis (MAP). Being overweight or obese. Having a diet that is: High in red meats, such as beef, pork, lamb, or liver. High in precooked, cured, or other processed meat, such as sausages, meat loaves, and hot dogs. Low in fiber, such as fiber found in whole grains, fruits, and vegetables. Being inactive (sedentary), smoking, or drinking too much alcohol. Having an inflammatory bowel disease, such as ulcerative colitis or Crohn's disease. What are the signs or symptoms? Early colorectal cancer often does not cause symptoms. As the cancer grows, symptoms may include: Changes in bowel habits. Feeling like the bowel does not empty completely after a bowel movement. Stools (feces) that are narrower than usual, or blood in the stool or toilet after a bowel movement. The blood may be bright red or very dark in color. Diarrhea, constipation, or frequent gas pain. Anemia, constant tiredness (fatigue), or nausea and vomiting. Discomfort, pain, bloating,  fullness, or cramps in the abdomen. Unexplained weight loss. How is this diagnosed? This condition may be diagnosed with: A medical history. A physical exam. Tests. These may include: An exam of the rectum using a gloved finger (digital rectal exam). A stool test called a fecal occult blood test. Blood tests. A biopsy. This is removal of a tissue sample from the colon or rectum to be looked at under a microscope. You may also have other tests, including: X-rays, CT scans, MRIs, or a PET scan. A sigmoidoscopy. This test is done to view the inside of the rectum. A colonoscopy. This test is done to view the inside of the colon. During this test, small polyps can be removed or biopsies may be taken. An endorectal ultrasound. This test checks how deep a tumor in the rectum has grown and whether the cancer has spread to lymph nodes or other nearby tissues. Additional tests may be done to find out whether the cancer has spread to other parts of the body (what stage it is). The stages of cancer include: Stage 0 - At this stage, the cancer is found only in the innermost lining of the colon or rectum. The tumor has not spread to other tissue. Stage 1 (I) - At this stage, the cancer has grown into the inner wall (muscle layer) of the colon or rectum. Stage 2 (II) - At this stage, the cancer has grown more deeply into the wall of the colon or rectum or through the wall. It may have invaded nearby tissue or organs. Stage 3 (III) - At this stage, the cancer has spread to nearby lymph nodes or tissue near the lymph nodes. Stage 4 (IV) - At this stage,  the cancer has spread to other parts of the body that are not near the colon, such as the liver or lungs. How is this treated? Treatment for this condition depends on the type and stage of the cancer. Treatment may include: Surgery. In the early stages of the cancer, surgery may be done to remove polyps or small tumors from the colon. In later stages, surgery  may be done to remove part of the colon (partial colectomy). Chemotherapy. This treatment uses medicines to kill cancer cells. Targeted therapy. This treatment can kill tumor cells by targeting specific gene mutations or proteins that the cancer expresses. Immunotherapy (biologic therapy). This treatment uses your body's disease-fighting system (immune system) to fight the cancer. Substances made by your body or in a laboratory are used to boost, direct, or restore your body's natural defenses against cancer. Radiation therapy. This treatment uses radiation to kill cancer cells or shrink tumors. Radiofrequency ablation. This treatment uses radio waves to destroy the tumors that may have spread to other areas of the body, such as the liver. Follow these instructions at home: Take over-the-counter and prescription medicines only as told by your health care provider. Try to eat regular, healthy meals. Some of your treatments might affect your appetite. Ask to meet with a dietitian if you are having problems eating or with your appetite. Consider joining a support group. This may help you learn about your diagnosis and manage the stress of having colorectal cancer. If you are admitted to the hospital, tell your cancer care team. Keep all follow-up visits. This is important. How is this prevented? Colorectal cancer can be prevented with screening tests that find polyps so they can be removed before they develop into cancer. All adults should have screening for colorectal cancer starting at age 11 and continuing until age 25. Your health care provider may recommend screening before age 65. People at increased risk should start screening at an earlier age. You may be able to help reduce your risk of developing colorectal cancer by staying at a healthy weight, eating a healthy diet, avoiding tobacco and alcohol use, and being physically active. Where to find more information American Cancer Society:  cancer.[place]): cancer.gov Contact a health care provider if: Your diarrhea or constipation does not go away. You have blood in your stool or in the toilet after a bowel movement. Your bowel habits change. You have increased pain in your abdomen. You notice new fatigue or weakness. You lose weight without a known reason. Get help right away if: You have increased bleeding from the rectum. You have any uncontrollable or severe abdominal symptoms. Summary Colorectal cancer is a cancerous (malignant) tumor in the colon or rectum, which are parts of the large intestine. Common risk factors for this condition include being older than age 85, having a personal or family history of colorectal cancer or colon polyps, having certain hereditary conditions, or having conditions such as diabetes or inflammatory bowel disease. This condition may be diagnosed with tests, such as a colonoscopy and biopsy. Treatment depends on the type and stage of the cancer. Often, treatment includes surgery to remove the abnormal tissue, along with chemotherapy, targeted therapy, or immunotherapy. Keep all follow-up visits. This is important. This information is not intended to replace advice given to you by your health care provider. Make sure you discuss any questions you have with your health care provider. Document Revised: 02/04/2020 Document Reviewed: 02/04/2020 Elsevier Patient Education  [place].

## 2021-09-27 NOTE — Progress Notes (Signed)
New Patient Office Visit  Subjective:  Patient ID: Samantha Floyd, female    DOB: 1971-10-30  Age: 50 y.o. MRN: [id number]  CC:  Chief Complaint  Patient presents with   New Patient (Initial Visit)     HPI Samantha Floyd presents to establish new primary care provider. She is coming from a practice which has recently closed down. She is needing to have routine physical with pap smear. She is also due to have routine, fasting blood work and colonoscopy.  She has suffered from insomnia since the onset of menopause. She has been taking OTC benadryl to help combat this as well as allergies. This has really helped. She sleeps well. She wakes up feeling refreshed.  She has no current concerns or complaints. She denies chest pain, chest pressure, or shortness of breath. She denies headaches or visual disturbances. she denies abdominal pain, nausea, vomiting, or changes in bowel or bladder habits.    History reviewed. No pertinent past medical history.  Past Surgical History:  Procedure Laterality Date   ADENOIDECTOMY  10/30/1974    Family History  Problem Relation Age of Onset   Hypertension Mother    Cancer Father    Hypertension Father    Heart disease Maternal Grandmother    Cancer Maternal Grandfather    Stroke Maternal Grandfather    Heart disease Paternal Grandmother    Hyperlipidemia Paternal Grandmother    Hypertension Paternal Grandmother    Stroke Paternal Grandmother    Diabetes Paternal Grandfather    Heart disease Paternal Grandfather    Hypertension Paternal Grandfather    Breast cancer Neg Hx     Social History   Socioeconomic History   Marital status: Married    Spouse name: Not on file   Number of children: Not on file   Years of education: Not on file   Highest education level: Not on file  Occupational History   Not on file  Tobacco Use   Smoking status: Never   Smokeless tobacco: Never  Substance and Sexual Activity   Alcohol use: No     Alcohol/week: 0.0 standard drinks   Drug use: No   Sexual activity: Not on file  Other Topics Concern   Not on file  Social History Narrative   Not on file   Social Determinants of Health   Financial Resource Strain: Not on file  Food Insecurity: Not on file  Transportation Needs: Not on file  Physical Activity: Not on file  Stress: Not on file  Social Connections: Not on file  Intimate Partner Violence: Not on file    ROS Review of Systems  Constitutional:  Negative for activity change, appetite change, chills, fatigue and fever.  HENT:  Negative for congestion, postnasal drip, rhinorrhea, sinus pressure, sinus pain, sneezing and sore throat.   Eyes: Negative.   Respiratory:  Negative for cough, chest tightness, shortness of breath and wheezing.   Cardiovascular:  Negative for chest pain and palpitations.  Gastrointestinal:  Negative for abdominal pain, constipation, diarrhea, nausea and vomiting.  Endocrine: Negative for cold intolerance, heat intolerance, polydipsia and polyuria.  Genitourinary:  Negative for dyspareunia, dysuria, flank pain, frequency and urgency.  Musculoskeletal:  Negative for arthralgias, back pain and myalgias.  Skin:  Negative for rash.  Allergic/Immunologic: Negative for environmental allergies.  Neurological:  Negative for dizziness, weakness and headaches.  Hematological:  Negative for adenopathy.  Psychiatric/Behavioral:  Positive for sleep disturbance. The patient is not nervous/anxious.    Objective:  Today's Vitals   09/27/21 0911  BP: 120/77  Pulse: 83  Temp: 98.4 F (36.9 C)  SpO2: 98%  Weight: 154 lb 14.4 oz (70.3 kg)  Height: 5\' 7"  (1.702 m)   Body mass index is 24.26 kg/m.   Physical Exam Vitals and nursing note reviewed.  Constitutional:      Appearance: Normal appearance. She is well-developed.  HENT:     Head: Normocephalic and atraumatic.     Nose: Nose normal.     Mouth/Throat:     Mouth: Mucous membranes are  moist.     Pharynx: Oropharynx is clear.  Eyes:     Pupils: Pupils are equal, round, and reactive to light.  Cardiovascular:     Rate and Rhythm: Normal rate and regular rhythm.     Pulses: Normal pulses.     Heart sounds: Normal heart sounds.  Pulmonary:     Effort: Pulmonary effort is normal.     Breath sounds: Normal breath sounds.  Abdominal:     Palpations: Abdomen is soft.  Musculoskeletal:        General: Normal range of motion.     Cervical back: Normal range of motion and neck supple.  Lymphadenopathy:     Cervical: No cervical adenopathy.  Skin:    General: Skin is warm and dry.     Capillary Refill: Capillary refill takes less than 2 seconds.  Neurological:     General: No focal deficit present.     Mental Status: She is alert and oriented to person, place, and time.  Psychiatric:        Mood and Affect: Mood normal.        Behavior: Behavior normal.        Thought Content: Thought content normal.        Judgment: Judgment normal.    Assessment & Plan:  1. Encounter to establish care Appointment today to establish new primary care provider    2. Dyslipidemia, goal LDL below 100 Check fasting lipid panel today and treat as indicated  - Lipid panel  3. Other fatigue Check thyroid panel  - T4, free - TSH  4. Impaired fasting glucose Check CMP and HgbA1c today. Treat abnormalities as indeciduate  - Comprehensive metabolic panel - Hemoglobin A1c  5. Iron deficiency anemia, unspecified iron deficiency anemia type Hx of low Hgb when attempting to donate blood. Check CBC today and treat deficiency as indicated  - CBC with Differential/Platelet  6. Screening for colon cancer Refer to GI for screening colonoscopy.  - Ambulatory referral to Gastroenterology  7. Healthcare maintenance Routine, fasting labs obtained during today's visit  - Comprehensive metabolic panel   Problem List Items Addressed This Visit       Endocrine   Impaired fasting glucose    Relevant Orders   Comprehensive metabolic panel   Hemoglobin A1c     Other   Dyslipidemia, goal LDL below 100   Relevant Orders   Lipid panel   Other fatigue   Relevant Orders   T4, free   TSH   Iron deficiency anemia   Relevant Orders   CBC with Differential/Platelet   Other Visit Diagnoses     Encounter to establish care    -  Primary   Screening for colon cancer       Relevant Orders   Ambulatory referral to Gastroenterology   Healthcare maintenance       Relevant Orders   Comprehensive metabolic panel  Outpatient Encounter Medications as of 09/27/2021  Medication Sig   Multiple Vitamins-Minerals (MULTI ADULT GUMMIES PO)    [DISCONTINUED] azithromycin (ZITHROMAX) 500 MG tablet For NONBLOODY diarrhea, take 2 tablet by mouth on day 1. If resolved, stop medication. If diarrhea persists, take 1 tablet on day 2 and 3. For BLOODY diarrhea, take 2 tablets on day 1 and 1 tab on days 2 and 3. (Patient not taking: Reported on 09/27/2021)   No facility-administered encounter medications on file as of 09/27/2021.    Follow-up: Return in about 6 months (around 03/27/2022) for health maintenance exam, with pap - will need to be set up for mammo and bone density at that time .Marland Kitchen   Ronnell Freshwater, NP

## 2021-09-28 ENCOUNTER — Other Ambulatory Visit: Payer: Self-pay | Admitting: Nurse Practitioner

## 2021-09-28 LAB — CBC WITH DIFFERENTIAL/PLATELET
Basophils Absolute: 0 10*3/uL (ref 0.0–0.2)
Basos: 1 %
EOS (ABSOLUTE): 0.1 10*3/uL (ref 0.0–0.4)
Eos: 2 %
Hematocrit: 41.1 % (ref 34.0–46.6)
Hemoglobin: 13.5 g/dL (ref 11.1–15.9)
Immature Grans (Abs): 0 10*3/uL (ref 0.0–0.1)
Immature Granulocytes: 0 %
Lymphocytes Absolute: 1.6 10*3/uL (ref 0.7–3.1)
Lymphs: 28 %
MCH: 30.3 pg (ref 26.6–33.0)
MCHC: 32.8 g/dL (ref 31.5–35.7)
MCV: 92 fL (ref 79–97)
Monocytes Absolute: 0.5 10*3/uL (ref 0.1–0.9)
Monocytes: 9 %
Neutrophils Absolute: 3.3 10*3/uL (ref 1.4–7.0)
Neutrophils: 60 %
Platelets: 254 10*3/uL (ref 150–450)
RBC: 4.45 x10E6/uL (ref 3.77–5.28)
RDW: 13.3 % (ref 11.7–15.4)
WBC: 5.5 10*3/uL (ref 3.4–10.8)

## 2021-09-28 LAB — COMPREHENSIVE METABOLIC PANEL
ALT: 24 IU/L (ref 0–32)
AST: 24 IU/L (ref 0–40)
Albumin/Globulin Ratio: 2 (ref 1.2–2.2)
Albumin: 4.4 g/dL (ref 3.8–4.9)
Alkaline Phosphatase: 99 IU/L (ref 44–121)
BUN/Creatinine Ratio: 20 (ref 9–23)
BUN: 17 mg/dL (ref 6–24)
Bilirubin Total: 0.3 mg/dL (ref 0.0–1.2)
CO2: 26 mmol/L (ref 20–29)
Calcium: 9.4 mg/dL (ref 8.7–10.2)
Chloride: 102 mmol/L (ref 96–106)
Creatinine, Ser: 0.86 mg/dL (ref 0.57–1.00)
Globulin, Total: 2.2 g/dL (ref 1.5–4.5)
Glucose: 87 mg/dL (ref 70–99)
Potassium: 4.4 mmol/L (ref 3.5–5.2)
Sodium: 139 mmol/L (ref 134–144)
Total Protein: 6.6 g/dL (ref 6.0–8.5)
eGFR: 81 mL/min/{1.73_m2} (ref >59)

## 2021-09-28 LAB — TSH: TSH: 4.73 u[IU]/mL — ABNORMAL HIGH (ref 0.450–4.500)

## 2021-09-28 LAB — T4, FREE: Free T4: 0.91 ng/dL (ref 0.82–1.77)

## 2021-09-28 LAB — LIPID PANEL
Chol/HDL Ratio: 3.3 ratio (ref 0.0–4.4)
Cholesterol, Total: 204 mg/dL — ABNORMAL HIGH (ref 100–199)
HDL: 61 mg/dL (ref >39)
LDL Chol Calc (NIH): 118 mg/dL — ABNORMAL HIGH (ref 0–99)
Triglycerides: 141 mg/dL (ref 0–149)
VLDL Cholesterol Cal: 25 mg/dL (ref 5–40)

## 2021-09-28 LAB — HEMOGLOBIN A1C
Est. average glucose Bld gHb Est-mCnc: 114 mg/dL
Hgb A1c MFr Bld: 5.6 % (ref 4.8–5.6)

## 2021-09-29 ENCOUNTER — Ambulatory Visit
Admission: RE | Admit: 2021-09-29 | Discharge: 2021-09-29 | Disposition: A | Discharge status: Home / Self Care | Payer: 59 | Source: Ambulatory Visit | Attending: Family Medicine | Admitting: Family Medicine

## 2021-09-29 DIAGNOSIS — R928 Other abnormal and inconclusive findings on diagnostic imaging of breast: Secondary | ICD-10-CM

## 2021-09-29 DIAGNOSIS — N6489 Other specified disorders of breast: Secondary | ICD-10-CM | POA: Diagnosis not present

## 2021-09-29 DIAGNOSIS — R922 Inconclusive mammogram: Secondary | ICD-10-CM | POA: Diagnosis not present

## 2021-10-11 ENCOUNTER — Other Ambulatory Visit: Payer: 59

## 2021-10-14 NOTE — Progress Notes (Signed)
Labs are good. Will recheck labs in one year

## 2021-10-18 ENCOUNTER — Encounter: Payer: Self-pay | Admitting: Gastroenterology

## 2021-11-09 DIAGNOSIS — H5212 Myopia, left eye: Secondary | ICD-10-CM | POA: Diagnosis not present

## 2021-11-21 ENCOUNTER — Ambulatory Visit (AMBULATORY_SURGERY_CENTER): Payer: 59 | Admitting: *Deleted

## 2021-11-21 ENCOUNTER — Other Ambulatory Visit: Payer: Self-pay

## 2021-11-21 ENCOUNTER — Other Ambulatory Visit (HOSPITAL_COMMUNITY): Payer: Self-pay

## 2021-11-21 VITALS — Ht 67.0 in | Wt 155.0 lb

## 2021-11-21 DIAGNOSIS — Z1211 Encounter for screening for malignant neoplasm of colon: Secondary | ICD-10-CM

## 2021-11-21 MED ORDER — NA SULFATE-K SULFATE-MG SULF 17.5-3.13-1.6 GM/177ML PO SOLN
1.0000 | Freq: Once | ORAL | 0 refills | Status: AC
  Filled 2021-11-21: qty 354, 1d supply, fill #0

## 2021-11-21 NOTE — Progress Notes (Signed)

## 2021-11-23 DIAGNOSIS — M65331 Trigger finger, right middle finger: Secondary | ICD-10-CM | POA: Diagnosis not present

## 2021-11-30 ENCOUNTER — Encounter: Payer: Self-pay | Admitting: Gastroenterology

## 2021-12-04 ENCOUNTER — Encounter: Payer: Self-pay | Admitting: Certified Registered Nurse Anesthetist

## 2021-12-05 ENCOUNTER — Ambulatory Visit (AMBULATORY_SURGERY_CENTER): Payer: 59 | Admitting: Gastroenterology

## 2021-12-05 ENCOUNTER — Encounter: Payer: Self-pay | Admitting: Gastroenterology

## 2021-12-05 ENCOUNTER — Other Ambulatory Visit: Payer: Self-pay

## 2021-12-05 VITALS — BP 118/82 | HR 71 | Temp 99.3°F | Resp 17 | Ht 67.0 in | Wt 155.0 lb

## 2021-12-05 DIAGNOSIS — Z1211 Encounter for screening for malignant neoplasm of colon: Secondary | ICD-10-CM | POA: Diagnosis not present

## 2021-12-05 DIAGNOSIS — D120 Benign neoplasm of cecum: Secondary | ICD-10-CM | POA: Diagnosis not present

## 2021-12-05 DIAGNOSIS — K5730 Diverticulosis of large intestine without perforation or abscess without bleeding: Secondary | ICD-10-CM

## 2021-12-05 MED ORDER — SODIUM CHLORIDE 0.9 % IV SOLN: 500.0000 mL | Freq: Once | INTRAVENOUS | Status: DC

## 2021-12-05 NOTE — Progress Notes (Signed)
Pt's states no medical or surgical changes since previsit or office visit. 

## 2021-12-05 NOTE — Progress Notes (Signed)
Report given to PACU, vss 

## 2021-12-05 NOTE — Progress Notes (Signed)
Called to room to assist during endoscopic procedure.  Patient ID and intended procedure confirmed with present staff. Received instructions for my participation in the procedure from the performing physician.  

## 2021-12-05 NOTE — Patient Instructions (Signed)
Handouts on polyps & diverticulosis given to you today  Await pathology results on polyp removed today   YOU HAD AN ENDOSCOPIC PROCEDURE TODAY AT [facility]:   Refer to the procedure report that was given to you for any specific questions about what was found during the examination.  If the procedure report does not answer your questions, please call your gastroenterologist to clarify.  If you requested that your care partner not be given the details of your procedure findings, then the procedure report has been included in a sealed envelope for you to review at your convenience later.  YOU SHOULD EXPECT: Some feelings of bloating in the abdomen. Passage of more gas than usual.  Walking can help get rid of the air that was put into your GI tract during the procedure and reduce the bloating. If you had a lower endoscopy (such as a colonoscopy or flexible sigmoidoscopy) you may notice spotting of blood in your stool or on the toilet paper. If you underwent a bowel prep for your procedure, you may not have a normal bowel movement for a few days.  Please Note:  You might notice some irritation and congestion in your nose or some drainage.  This is from the oxygen used during your procedure.  There is no need for concern and it should clear up in a day or so.  SYMPTOMS TO REPORT IMMEDIATELY:  Following lower endoscopy (colonoscopy or flexible sigmoidoscopy):  Excessive amounts of blood in the stool  Significant tenderness or worsening of abdominal pains  Swelling of the abdomen that is new, acute  Fever of 100F or higher   For urgent or emergent issues, a gastroenterologist can be reached at any hour by calling [phone]. Do not use MyChart messaging for urgent concerns.    DIET:  We do recommend a small meal at first, but then you may proceed to your regular diet.  Drink plenty of fluids but you should avoid alcoholic beverages for 24 hours.  ACTIVITY:  You should  plan to take it easy for the rest of today and you should NOT DRIVE or use heavy machinery until tomorrow (because of the sedation medicines used during the test).    FOLLOW UP: Our staff will call the number listed on your records 48-72 hours following your procedure to check on you and address any questions or concerns that you may have regarding the information given to you following your procedure. If we do not reach you, we will leave a message.  We will attempt to reach you two times.  During this call, we will ask if you have developed any symptoms of COVID 19. If you develop any symptoms (ie: fever, flu-like symptoms, shortness of breath, cough etc.) before then, please call [phone].  If you test positive for Covid 19 in the 2 weeks post procedure, please call and report this information to [place].    If any biopsies were taken you will be contacted by phone or by letter within the next 1-3 weeks.  Please call us at [phone] if you have not heard about the biopsies in 3 weeks.    SIGNATURES/CONFIDENTIALITY: You and/or your care partner have signed paperwork which will be entered into your electronic medical record.  These signatures attest to the fact that that the information above on your After Visit Summary has been reviewed and is understood.  Full responsibility of the confidentiality of this discharge information lies with you and/or your  care-partner.

## 2021-12-05 NOTE — Progress Notes (Signed)
GASTROENTEROLOGY PROCEDURE H&P NOTE   Primary Care Physician: Ronnell Freshwater, NP    Reason for Procedure:  Colon Cancer screening  Plan:    Colonoscopy  Patient is appropriate for endoscopic procedure(s) in the ambulatory ([facility]) setting.  The nature of the procedure, as well as the risks, benefits, and alternatives were carefully and thoroughly reviewed with the patient. Ample time for discussion and questions allowed. The patient understood, was satisfied, and agreed to proceed.     HPI: Samantha Floyd is a 51 y.o. female who presents for colonoscopy for routine Colon Cancer screening.  No active GI symptoms.  No known family history of colon cancer or related malignancy.  Patient is otherwise without complaints or active issues today.  Past Medical History:  Diagnosis Date   Allergy    SEASONAL    Past Surgical History:  Procedure Laterality Date   ADENOIDECTOMY  10/30/1974   LASIK     1999    Prior to Admission medications   Medication Sig Start Date End Date Taking? Authorizing Provider  BLACK COHOSH PO Take 40 mg by mouth daily. TAKE ONE   Yes [provider]  Calcium Carb-Cholecalciferol (CALCIUM 500 + D3 PO) Take by mouth. TAKE TWO GUMMIES ONCE DAILY   Yes [provider]  diphenhydrAMINE (BENADRYL) 25 MG tablet as needed. 05/02/19  Yes [provider]  fluticasone (FLONASE) 50 MCG/ACT nasal spray as needed. 08/02/19  Yes [provider]  Multiple Vitamins-Minerals ([place])  12/31/18  Yes [provider]  Turmeric 500 MG CAPS Take by mouth daily. TAKE ONE   Yes [provider]  levocetirizine (XYZAL) 5 MG tablet as needed. 12/29/19   [provider]    Current Outpatient Medications  Medication Sig Dispense Refill   BLACK COHOSH PO Take 40 mg by mouth daily. TAKE ONE     Calcium Carb-Cholecalciferol (CALCIUM 500 + D3 PO) Take by mouth. TAKE TWO GUMMIES ONCE DAILY     diphenhydrAMINE  (BENADRYL) 25 MG tablet as needed.     fluticasone (FLONASE) 50 MCG/ACT nasal spray as needed.     Multiple Vitamins-Minerals (MULTI ADULT GUMMIES PO)      Turmeric 500 MG CAPS Take by mouth daily. TAKE ONE     levocetirizine (XYZAL) 5 MG tablet as needed.     Current Facility-Administered Medications  Medication Dose Route Frequency Provider Last Rate Last Admin   0.9 %  sodium chloride infusion  500 mL Intravenous Once Jordi Lacko V, DO        Allergies as of 12/05/2021   (No Known Allergies)    Family History  Problem Relation Age of Onset   Hypertension Mother    Cancer Father    Hypertension Father    Heart disease Maternal Grandmother    Cancer Maternal Grandfather    Stroke Maternal Grandfather    Heart disease Paternal Grandmother    Hyperlipidemia Paternal Grandmother    Hypertension Paternal Grandmother    Stroke Paternal Grandmother    Diabetes Paternal Grandfather    Heart disease Paternal Grandfather    Hypertension Paternal Grandfather    Breast cancer Neg Hx    Colon cancer Neg Hx    Colon polyps Neg Hx    Esophageal cancer Neg Hx    Rectal cancer Neg Hx    Stomach cancer Neg Hx     Social History   Socioeconomic History   Marital status: Married    Spouse name:  Not on file   Number of children: Not on file   Years of education: Not on file   Highest education level: Not on file  Occupational History   Not on file  Tobacco Use   Smoking status: Never   Smokeless tobacco: Never  Vaping Use   Vaping Use: Never used  Substance and Sexual Activity   Alcohol use: No    Alcohol/week: 0.0 standard drinks   Drug use: No   Sexual activity: Not on file  Other Topics Concern   Not on file  Social History Narrative   Not on file   Social Determinants of Health   Financial Resource Strain: Not on file  Food Insecurity: Not on file  Transportation Needs: Not on file  Physical Activity: Not on file  Stress: Not on file  Social Connections:  Not on file  Intimate Partner Violence: Not on file    Physical Exam: Vital signs in last 24 hours: @BP  109/88    Pulse 89    Temp 99.3 F (37.4 C)    Ht 5\' 7"  (1.702 m)    Wt 155 lb (70.3 kg)    LMP 10/24/2021    SpO2 98%    BMI 24.28 kg/m  GEN: NAD EYE: Sclerae anicteric ENT: MMM CV: Non-tachycardic Pulm: CTA b/l GI: Soft, NT/ND NEURO:  Alert & Oriented x 3   Gerrit Heck, DO [facility]   12/05/2021 7:46 AM

## 2021-12-05 NOTE — Op Note (Signed)
[name] Procedure Date: 12/05/2021 7:14 AM MRN: [id number] Endoscopist: Gerrit Heck , MD Age: 51 Referring MD:  Date of Birth: 04-24-1971 Gender: Female Account #: [id number] Procedure:                Colonoscopy Indications:              Screening for colorectal malignant neoplasm, This                            is the patient's first colonoscopy Medicines:                Monitored Anesthesia Care Procedure:                Pre-Anesthesia Assessment:                           - Prior to the procedure, a History and Physical                            was performed, and patient medications and                            allergies were reviewed. The patient's tolerance of                            previous anesthesia was also reviewed. The risks                            and benefits of the procedure and the sedation                            options and risks were discussed with the patient.                            All questions were answered, and informed consent                            was obtained. Prior Anticoagulants: The patient has                            taken no previous anticoagulant or antiplatelet                            agents. ASA Grade Assessment: I - A normal, healthy                            patient. After reviewing the risks and benefits,                            the patient was deemed in satisfactory condition to                            undergo the procedure.  After obtaining informed consent, the colonoscope                            was passed under direct vision. Throughout the                            procedure, the patient's blood pressure, pulse, and                            oxygen saturations were monitored continuously. The                            CF [id number] #[id number] was introduced through the anus                            and advanced to the the terminal  ileum. The                            colonoscopy was performed without difficulty. The                            patient tolerated the procedure well. The quality                            of the bowel preparation was excellent. The                            terminal ileum, ileocecal valve, appendiceal                            orifice, and rectum were photographed. Scope In: 7:58:20 AM Scope Out: 8:15:43 AM Scope Withdrawal Time: 0 hours 11 minutes 58 seconds  Total Procedure Duration: 0 hours 17 minutes 23 seconds  Findings:                 The perianal and digital rectal examinations were                            normal.                           A 3 mm polyp was found in the cecum. The polyp was                            sessile. The polyp was removed with a cold snare.                            Resection and retrieval were complete. Estimated                            blood loss was minimal.                           A few small-mouthed diverticula were found in the  sigmoid colon and ascending colon.                           The exam was otherwise normal throughout the                            remainder of the colon.                           The retroflexed view of the distal rectum and anal                            verge was normal and showed no anal or rectal                            abnormalities.                           The terminal ileum appeared normal. Complications:            No immediate complications. Estimated Blood Loss:     Estimated blood loss was minimal. Impression:               - One 3 mm polyp in the cecum, removed with a cold                            snare. Resected and retrieved.                           - Diverticulosis in the sigmoid colon and in the                            ascending colon.                           - The distal rectum and anal verge are normal on                            retroflexion  view.                           - The examined portion of the ileum was normal. Recommendation:           - Patient has a contact number available for                            emergencies. The signs and symptoms of potential                            delayed complications were discussed with the                            patient. Return to normal activities tomorrow.                            Written discharge instructions were provided  to the                            patient.                           - Resume previous diet.                           - Continue present medications.                           - Await pathology results.                           - Repeat colonoscopy for surveillance based on                            pathology results.                           - Return to GI office PRN. Gerrit Heck, MD 12/05/2021 8:20:36 AM

## 2021-12-07 ENCOUNTER — Encounter: Payer: Self-pay | Admitting: Gastroenterology

## 2021-12-07 ENCOUNTER — Telehealth: Payer: Self-pay

## 2021-12-07 NOTE — Telephone Encounter (Signed)
Left message on follow up call. 

## 2021-12-07 NOTE — Telephone Encounter (Signed)
No answer, left message to call if having any issues or concerns, B.Brittini Brubeck RN 

## 2022-01-05 ENCOUNTER — Encounter: Payer: Self-pay | Admitting: Nurse Practitioner

## 2022-01-05 ENCOUNTER — Other Ambulatory Visit (HOSPITAL_COMMUNITY): Payer: Self-pay

## 2022-01-05 ENCOUNTER — Other Ambulatory Visit: Payer: Self-pay | Admitting: Nurse Practitioner

## 2022-01-05 DIAGNOSIS — A09 Infectious gastroenteritis and colitis, unspecified: Secondary | ICD-10-CM

## 2022-01-05 MED ORDER — AZITHROMYCIN 250 MG PO TABS
250.0000 mg | ORAL_TABLET | Freq: Every day | ORAL | 0 refills | Status: DC
  Filled 2022-01-05: qty 10, 10d supply, fill #0

## 2022-01-05 NOTE — Progress Notes (Signed)
Prescriptor for zithromax '250mg'$  sent to [facility]. Take 2 tablets on day one then take 1 tabletg daily for 9 additional days.  ?

## 2022-01-06 ENCOUNTER — Other Ambulatory Visit (HOSPITAL_COMMUNITY): Payer: Self-pay

## 2022-01-06 MED ORDER — AZITHROMYCIN 500 MG PO TABS
ORAL_TABLET | ORAL | 0 refills | Status: DC
  Filled 2022-01-06: qty 4, 3d supply, fill #0

## 2022-03-31 ENCOUNTER — Encounter: Payer: Self-pay | Admitting: Plastic Surgery

## 2022-03-31 ENCOUNTER — Ambulatory Visit (INDEPENDENT_AMBULATORY_CARE_PROVIDER_SITE_OTHER): Payer: Self-pay | Admitting: Physician Assistant

## 2022-03-31 DIAGNOSIS — Z411 Encounter for cosmetic surgery: Secondary | ICD-10-CM

## 2022-03-31 NOTE — Progress Notes (Signed)
Preoperative Dx: skin aging  Postoperative Dx:  same  Procedure: Halo laser to face   Anesthesia: none  Description of Procedure:  Risks and complications were explained to the patient. Consent was confirmed and signed. Time out was called and all information was confirmed to be correct. The area  area was prepped with alcohol and wiped dry.  Halo Laser: See picture in media for laser parameters and coverage.  The patient tolerated the procedure well and there were no complications. She was provided with post-procedural instructions.  The patient is to follow up in 4 weeks.

## 2022-04-05 ENCOUNTER — Other Ambulatory Visit: Payer: Self-pay

## 2022-04-05 ENCOUNTER — Encounter (HOSPITAL_BASED_OUTPATIENT_CLINIC_OR_DEPARTMENT_OTHER): Payer: Self-pay

## 2022-04-05 ENCOUNTER — Emergency Department (HOSPITAL_BASED_OUTPATIENT_CLINIC_OR_DEPARTMENT_OTHER): Payer: 59

## 2022-04-05 ENCOUNTER — Observation Stay (HOSPITAL_BASED_OUTPATIENT_CLINIC_OR_DEPARTMENT_OTHER)
Admission: EM | Admit: 2022-04-05 | Discharge: 2022-04-06 | Disposition: A | Discharge status: Home / Self Care | Payer: 59 | Attending: Internal Medicine | Admitting: Internal Medicine

## 2022-04-05 DIAGNOSIS — S0101XA Laceration without foreign body of scalp, initial encounter: Secondary | ICD-10-CM | POA: Diagnosis not present

## 2022-04-05 DIAGNOSIS — W19XXXA Unspecified fall, initial encounter: Secondary | ICD-10-CM | POA: Insufficient documentation

## 2022-04-05 DIAGNOSIS — R55 Syncope and collapse: Principal | ICD-10-CM | POA: Insufficient documentation

## 2022-04-05 LAB — CBC
HCT: 39.4 % (ref 36.0–46.0)
Hemoglobin: 12.7 g/dL (ref 12.0–15.0)
MCH: 28.4 pg (ref 26.0–34.0)
MCHC: 32.2 g/dL (ref 30.0–36.0)
MCV: 88.1 fL (ref 80.0–100.0)
Platelets: 304 10*3/uL (ref 150–400)
RBC: 4.47 MIL/uL (ref 3.87–5.11)
RDW: 14.2 % (ref 11.5–15.5)
WBC: 9.8 10*3/uL (ref 4.0–10.5)
nRBC: 0 % (ref 0.0–0.2)

## 2022-04-05 LAB — CBG MONITORING, ED: Glucose-Capillary: 110 mg/dL — ABNORMAL HIGH (ref 70–99)

## 2022-04-05 LAB — BASIC METABOLIC PANEL
Anion gap: 9 (ref 5–15)
BUN: 21 mg/dL — ABNORMAL HIGH (ref 6–20)
CO2: 28 mmol/L (ref 22–32)
Calcium: 10.1 mg/dL (ref 8.9–10.3)
Chloride: 101 mmol/L (ref 98–111)
Creatinine, Ser: 0.78 mg/dL (ref 0.44–1.00)
GFR, Estimated: >60 mL/min (ref >60)
Glucose, Bld: 112 mg/dL — ABNORMAL HIGH (ref 70–99)
Potassium: 3.9 mmol/L (ref 3.5–5.1)
Sodium: 138 mmol/L (ref 135–145)

## 2022-04-05 LAB — URINALYSIS, ROUTINE W REFLEX MICROSCOPIC
Bilirubin Urine: NEGATIVE
Glucose, UA: NEGATIVE mg/dL
Hgb urine dipstick: NEGATIVE
Ketones, ur: NEGATIVE mg/dL
Leukocytes,Ua: NEGATIVE
Nitrite: NEGATIVE
Protein, ur: NEGATIVE mg/dL
Specific Gravity, Urine: 1.014 (ref 1.005–1.030)
pH: 5.5 (ref 5.0–8.0)

## 2022-04-05 LAB — PREGNANCY, URINE: Preg Test, Ur: NEGATIVE

## 2022-04-05 MED ORDER — ACETAMINOPHEN 325 MG PO TABS
ORAL_TABLET | ORAL | Status: AC
  Administered 2022-04-05: 650 mg
  Filled 2022-04-05: qty 2

## 2022-04-05 MED ORDER — ENOXAPARIN SODIUM 40 MG/0.4ML IJ SOSY
40.0000 mg | PREFILLED_SYRINGE | INTRAMUSCULAR | Status: DC
Start: 2022-04-06 — End: 2022-04-05

## 2022-04-05 MED ORDER — ACETAMINOPHEN 650 MG RE SUPP: 650.0000 mg | Freq: Four times a day (QID) | RECTAL | Status: DC | PRN

## 2022-04-05 MED ORDER — SODIUM CHLORIDE 0.9 % IV BOLUS
1000.0000 mL | Freq: Once | INTRAVENOUS | Status: AC
  Administered 2022-04-05: 1000 mL via INTRAVENOUS

## 2022-04-05 MED ORDER — SODIUM CHLORIDE 0.9 % IV SOLN: INTRAVENOUS | Status: DC

## 2022-04-05 MED ORDER — ACETAMINOPHEN 325 MG PO TABS: 650.0000 mg | ORAL_TABLET | Freq: Four times a day (QID) | ORAL | Status: DC | PRN

## 2022-04-05 MED ORDER — ENOXAPARIN SODIUM 40 MG/0.4ML IJ SOSY: 40.0000 mg | PREFILLED_SYRINGE | INTRAMUSCULAR | Status: DC

## 2022-04-05 NOTE — H&P (Addendum)
History and Physical    CHRISTEL BAI FIE:[id number] DOB: 1971-10-26 DOA: 04/05/2022  PCP: Ronnell Freshwater, NP  Patient coming from: Home.  Chief Complaint: Loss of consciousness.  HPI: Samantha Floyd is a 51 y.o. female with no significant past medical history presents to the ER after patient had loss of consciousness.  Patient states that this morning while at work patient at around 10 AM had a sensation of dj vu-like things have happened before which lasted for few minutes and resolved.  At around 1 PM patient got the sensation again but this time this was followed by a brief loss of consciousness.  Patient did hit her head on the floor.  Did not have any incontinence of urine or seizure-like activities did not bite her tongue.  Patient's bystanders immediately attended to the patient and patient was in a medical facility and was found to have bradycardia in the 30s.  Soon the bradycardia resolved and patient was taken to the ER.  Patient did not have any chest pain or shortness of breath during the episode.  Patient states over the last few days patient has had at least 2 or 3 episodes of syncope usually related to dehydration.  Last one was in the month of August 2022 when patient was serving in [place].  At that time patient did not have any sensation of dj vu.  ED Course: While in the ER patient had another episode of bradycardia with heart rate in the 40s when patient felt dizzy during that episode and resolved after the heart rate improved.  CT head was unremarkable EKG shows normal sinus rhythm with QTc of 440 ms.  Patient admitted for further observation.  Patient was given 1 L fluid bolus in the ER.  Review of Systems: As per HPI, rest all negative.   Past Medical History:  Diagnosis Date   Allergy    SEASONAL    Past Surgical History:  Procedure Laterality Date   ADENOIDECTOMY  10/30/1974   LASIK     1999     reports that she has never smoked. She has never used  smokeless tobacco. She reports that she does not drink alcohol and does not use drugs.  No Known Allergies  Family History  Problem Relation Age of Onset   Hypertension Mother    Cancer Father    Hypertension Father    Heart disease Maternal Grandmother    Cancer Maternal Grandfather    Stroke Maternal Grandfather    Heart disease Paternal Grandmother    Hyperlipidemia Paternal Grandmother    Hypertension Paternal Grandmother    Stroke Paternal Grandmother    Diabetes Paternal Grandfather    Heart disease Paternal Grandfather    Hypertension Paternal Grandfather    Breast cancer Neg Hx    Colon cancer Neg Hx    Colon polyps Neg Hx    Esophageal cancer Neg Hx    Rectal cancer Neg Hx    Stomach cancer Neg Hx     Prior to Admission medications   Medication Sig Start Date End Date Taking? Authorizing Provider  azithromycin (ZITHROMAX) 250 MG tablet Take 1 tablet (250 mg total) by mouth daily. 01/05/22   Ronnell Freshwater, NP  azithromycin (ZITHROMAX) 500 MG tablet For NONBLOODY diarrhea, take 2 tablets ('1000mg'$ ) on day1.If resolved, stop medication. If diarrhea persists, take 1 tablet on days2-3. For BLOODY diarrhea, take 2 tabs on day 1 and 1 tablet on day 2-3 01/06/22   Odis Luster,  MD  BLACK COHOSH PO Take 40 mg by mouth daily. TAKE ONE    [provider]  Calcium Carb-Cholecalciferol (CALCIUM 500 + D3 PO) Take by mouth. TAKE TWO GUMMIES ONCE DAILY    [provider]  diphenhydrAMINE (BENADRYL) 25 MG tablet as needed. 05/02/19   [provider]  fluticasone (FLONASE) 50 MCG/ACT nasal spray as needed. 08/02/19   [provider]  levocetirizine (XYZAL) 5 MG tablet as needed. 12/29/19   [provider]  Multiple Vitamins-Minerals ([place])  12/31/18   [provider]  Turmeric 500 MG CAPS Take by mouth daily. TAKE ONE    [provider]    Physical Exam: Constitutional: Moderately built and  nourished. Vitals:   04/05/22 2000 04/05/22 2045 04/05/22 2115 04/05/22 2158  BP: 129/89 126/88 133/87 101/79  Pulse: 88 80 82 78  Resp: 20 20 (!) 22 16  Temp:  97.8 F (36.6 C)  97.9 F (36.6 C)  TempSrc:  Oral  Oral  SpO2: 98% 97% 99% 98%  Weight:    69.3 kg  Height:    '5\' 7"'$  (1.702 m)   Eyes: Anicteric no pallor. ENMT: No discharge from the ears eyes nose and mouth: Neck: No mass felt.  No neck rigidity. Respiratory: No rhonchi or crepitations. Cardiovascular: S1-S2 heard. Abdomen: Soft nontender bowel sound present. Musculoskeletal: No edema. Skin: No rash. Neurologic: Alert awake oriented time place and person.  Moves all extremities 5 x 5.  No facial asymmetry tongue is midline. Psychiatric: Appears normal.  Normal affect.   Labs on Admission: I have personally reviewed following labs and imaging studies  CBC: Recent Labs  Lab 04/05/22 1609  WBC 9.8  HGB 12.7  HCT 39.4  MCV 88.1  PLT 470   Basic Metabolic Panel: Recent Labs  Lab 04/05/22 1609  NA 138  K 3.9  CL 101  CO2 28  GLUCOSE 112*  BUN 21*  CREATININE 0.78  CALCIUM 10.1   GFR: Estimated Creatinine Clearance: 79.1 mL/min (by C-G formula based on SCr of 0.78 mg/dL). Liver Function Tests: No results for input(s): AST, ALT, ALKPHOS, BILITOT, PROT, ALBUMIN in the last 168 hours. No results for input(s): LIPASE, AMYLASE in the last 168 hours. No results for input(s): AMMONIA in the last 168 hours. Coagulation Profile: No results for input(s): INR, PROTIME in the last 168 hours. Cardiac Enzymes: No results for input(s): CKTOTAL, CKMB, CKMBINDEX, TROPONINI in the last 168 hours. BNP (last 3 results) No results for input(s): PROBNP in the last 8760 hours. HbA1C: No results for input(s): HGBA1C in the last 72 hours. CBG: Recent Labs  Lab 04/05/22 1456  GLUCAP 110*   Lipid Profile: No results for input(s): CHOL, HDL, LDLCALC, TRIG, CHOLHDL, LDLDIRECT in the last 72 hours. Thyroid Function  Tests: No results for input(s): TSH, T4TOTAL, FREET4, T3FREE, THYROIDAB in the last 72 hours. Anemia Panel: No results for input(s): VITAMINB12, FOLATE, FERRITIN, TIBC, IRON, RETICCTPCT in the last 72 hours. Urine analysis:    Component Value Date/Time   COLORURINE COLORLESS (A) 04/05/2022 1701   APPEARANCEUR CLEAR 04/05/2022 1701   LABSPEC 1.014 04/05/2022 1701   PHURINE 5.5 04/05/2022 1701   GLUCOSEU NEGATIVE 04/05/2022 1701   HGBUR NEGATIVE 04/05/2022 1701   BILIRUBINUR NEGATIVE 04/05/2022 1701   BILIRUBINUR negative 08/28/2016 1534   KETONESUR NEGATIVE 04/05/2022 1701   PROTEINUR NEGATIVE 04/05/2022 1701   UROBILINOGEN 0.2 08/28/2016 1534   NITRITE NEGATIVE 04/05/2022 1701   LEUKOCYTESUR NEGATIVE 04/05/2022 1701  Sepsis Labs: '@LABRCNTIP'$ (procalcitonin:4,lacticidven:4) )No results found for this or any previous visit (from the past 240 hour(s)).   Radiological Exams on Admission: CT Head Wo Contrast  Result Date: 04/05/2022 CLINICAL DATA:  Syncope/presyncope, cerebrovascular cause suspected EXAM: CT HEAD WITHOUT CONTRAST TECHNIQUE: Contiguous axial images were obtained from the base of the skull through the vertex without intravenous contrast. RADIATION DOSE REDUCTION: This exam was performed according to the departmental dose-optimization program which includes automated exposure control, adjustment of the mA and/or kV according to patient size and/or use of iterative reconstruction technique. COMPARISON:  None Available. FINDINGS: Brain: No evidence of large-territorial acute infarction. No parenchymal hemorrhage. No mass lesion. No extra-axial collection. No mass effect or midline shift. No hydrocephalus. Basilar cisterns are patent. Vascular: No hyperdense vessel. Skull: No acute fracture or focal lesion. Sinuses/Orbits: Bilateral maxillary mucosal thickening. Otherwise paranasal sinuses and mastoid air cells are clear. The orbits are unremarkable. Other: None. IMPRESSION: No acute  intracranial abnormality. Electronically Signed   By: Iven Finn M.D.   On: 04/05/2022 16:27   DG Chest Port 1 View  Result Date: 04/05/2022 CLINICAL DATA:  Syncope and fall. EXAM: PORTABLE CHEST 1 VIEW COMPARISON:  None Available. FINDINGS: The heart size and mediastinal contours are within normal limits. Both lungs are clear. The visualized skeletal structures are unremarkable. IMPRESSION: No active disease. Electronically Signed   By: Titus Dubin M.D.   On: 04/05/2022 18:20    EKG: Independently reviewed.  Normal sinus rhythm.  Assessment/Plan Principal Problem:   Syncope    Syncope -cause not clear.  Patient was bradycardic at the scene and also was briefly bradycardic in the ER and has had symptoms during the bradycardic episode.  Cardiologist was notified and requested admission for monitoring and probably will need outpatient cardiac monitoring if there is no significant bradycardia during the admission.  We will check TSH 2D echo.  Since patient also had a sensation of dj vu we will check EEG.  Check orthostatics. Headache -since the fall patient has been in some frontal headache.  Patient states she usually does not get headaches.  Headache started after she hit her head.  Could be due to concussion.  Denies any visual symptoms.  No focal deficits.  Will check sed rate and order 1 dose of ibuprofen since the Tylenol did not relieve the headache.   DVT prophylaxis: Lovenox. Code Status: Full code. Family Communication: Discussed with patient. Disposition Plan: Home. Consults called: ER physician discussed with cardiologist. Admission status: Observation.   Rise Patience MD [facility]   If 7PM-7AM, please contact night-coverage [email or web address] Password [email or web address]  04/05/2022, 11:18 PM

## 2022-04-05 NOTE — ED Notes (Signed)
Report given to [facility] @ [facility]

## 2022-04-05 NOTE — ED Triage Notes (Signed)
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 Pt did lose consciousness for no longer than 5 mins. Pt hit her head on the floor, has a small laceration to the back of her head. Pt is not on blood thinners. Pt states before the syncopal episode she had a " dejavu feeling" and dizziness. Pt reports staff members checked her BG and it was 97. Pt is alert and oriented x4.

## 2022-04-05 NOTE — ED Provider Notes (Signed)
[facility] EMERGENCY DEPT Provider Note   CSN: [id number] Arrival date & time: 04/05/22  1430     History  Chief Complaint  Patient presents with   Loss of Consciousness   Dizziness    Samantha Floyd is a 51 y.o. female.  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  Patient went completely backwards after passing out hit the back of her head small laceration to the back of the head.  Patient definitely feels she passed out before she fell.  There was no stumbling or tripping.  Patient stated she had a couple episodes of feeling a little lightheaded and feeling like something was strange they were very very brief and then the actual syncopal episode she had the same feeling briefly before no diaphoresis no nausea no vomiting.  Patient upon arrival here stated that when she was hooked up to monitor the nurse noted that her heart rate going down to 30 and she felt a little strange again.  We were unable to capture that.  There was another event later that we were not able to backup our monitoring devices enough to see if we could see it.  Monitoring here now for a while has been sinus rhythm with a heart rate anywhere from 62 to the mid 80s.  No arrhythmias at all.  EKG without any acute findings.  Patient has had some passing out in the past but these really sounded more like a vasovagal and related to dehydration or either a stomach virus.  Past medical history to sniffing for allergies.  Patient currently completely asymptomatic.      Home Medications Prior to Admission medications   Medication Sig Start Date End Date Taking? Authorizing Provider  azithromycin (ZITHROMAX) 250 MG tablet Take 1 tablet (250 mg total) by mouth daily. 01/05/22   Ronnell Freshwater, NP  azithromycin (ZITHROMAX) 500 MG tablet For NONBLOODY diarrhea, take 2 tablets ('1000mg'$ ) on day1.If resolved, stop medication. If diarrhea persists, take 1 tablet  on days2-3. For BLOODY diarrhea, take 2 tabs on day 1 and 1 tablet on day 2-3 01/06/22   Odis Luster, MD  BLACK COHOSH PO Take 40 mg by mouth daily. TAKE ONE    [provider]  Calcium Carb-Cholecalciferol (CALCIUM 500 + D3 PO) Take by mouth. TAKE TWO GUMMIES ONCE DAILY    [provider]  diphenhydrAMINE (BENADRYL) 25 MG tablet as needed. 05/02/19   [provider]  fluticasone (FLONASE) 50 MCG/ACT nasal spray as needed. 08/02/19   [provider]  levocetirizine (XYZAL) 5 MG tablet as needed. 12/29/19   [provider]  Multiple Vitamins-Minerals ([place])  12/31/18   [provider]  Turmeric 500 MG CAPS Take by mouth daily. TAKE ONE    [provider]      Allergies    Patient has no known allergies.    Review of Systems   Review of Systems  Constitutional:  Negative for chills and fever.  HENT:  Negative for ear pain and sore throat.   Eyes:  Negative for pain and visual disturbance.  Respiratory:  Negative for cough and shortness of breath.   Cardiovascular:  Negative for chest pain and palpitations.  Gastrointestinal:  Negative for abdominal pain and vomiting.  Genitourinary:  Negative for dysuria and hematuria.  Musculoskeletal:  Negative for arthralgias and back pain.  Skin:  Negative for color change and rash.  Neurological:  Positive for syncope and light-headedness. Negative for seizures.  All other systems reviewed and are negative.  Physical Exam Updated Vital Signs BP (!) 133/91   Pulse 85   Temp 98.1 F (36.7 C)   Resp 18   Ht 1.702 m ('5\' 7"'$ )   Wt 70.3 kg   SpO2 100%   BMI 24.28 kg/m  Physical Exam Vitals and nursing note reviewed.  Constitutional:      General: She is not in acute distress.    Appearance: Normal appearance. She is well-developed.  HENT:     Head: Normocephalic.     Comments: Left occiput part of the head with about a 1 cm area of abrasion and about a 3 mm  laceration.  No active bleeding.    Mouth/Throat:     Mouth: Mucous membranes are moist.  Eyes:     Extraocular Movements: Extraocular movements intact.     Conjunctiva/sclera: Conjunctivae normal.     Pupils: Pupils are equal, round, and reactive to light.  Cardiovascular:     Rate and Rhythm: Normal rate and regular rhythm.     Heart sounds: No murmur heard. Pulmonary:     Effort: Pulmonary effort is normal. No respiratory distress.     Breath sounds: Normal breath sounds.  Abdominal:     Palpations: Abdomen is soft.     Tenderness: There is no abdominal tenderness.  Musculoskeletal:        General: No swelling.     Cervical back: Normal range of motion and neck supple.  Skin:    General: Skin is warm and dry.     Capillary Refill: Capillary refill takes less than 2 seconds.  Neurological:     General: No focal deficit present.     Mental Status: She is alert and oriented to person, place, and time.     Cranial Nerves: No cranial nerve deficit.     Sensory: No sensory deficit.     Motor: No weakness.     Coordination: Coordination normal.  Psychiatric:        Mood and Affect: Mood normal.    ED Results / Procedures / Treatments   Labs (all labs ordered are listed, but only abnormal results are displayed) Labs Reviewed  BASIC METABOLIC PANEL - Abnormal; Notable for the following components:      Result Value   Glucose, Bld 112 (*)    BUN 21 (*)    All other components within normal limits  URINALYSIS, ROUTINE W REFLEX MICROSCOPIC - Abnormal; Notable for the following components:   Color, Urine COLORLESS (*)    All other components within normal limits  CBG MONITORING, ED - Abnormal; Notable for the following components:   Glucose-Capillary 110 (*)    All other components within normal limits  CBC  PREGNANCY, URINE    EKG EKG Interpretation  Date/Time:  Wednesday April 05 2022 14:39:32 EDT Ventricular Rate:  78 PR Interval:  138 QRS Duration: 88 QT  Interval:  386 QTC Calculation: 440 R Axis:   14 Text Interpretation: Normal sinus rhythm with sinus arrhythmia Minimal voltage criteria for LVH, may be normal variant ( R in aVL ) Borderline ECG No previous ECGs available Confirmed by Lacretia Leigh ([id number]) on 04/05/2022 2:46:02 PM  Radiology CT Head Wo Contrast  Result Date: 04/05/2022 CLINICAL DATA:  Syncope/presyncope, cerebrovascular cause suspected EXAM: CT HEAD WITHOUT CONTRAST TECHNIQUE: Contiguous axial images were obtained from the base of the skull through the vertex without intravenous contrast. RADIATION  DOSE REDUCTION: This exam was performed according to the departmental dose-optimization program which includes automated exposure control, adjustment of the mA and/or kV according to patient size and/or use of iterative reconstruction technique. COMPARISON:  None Available. FINDINGS: Brain: No evidence of large-territorial acute infarction. No parenchymal hemorrhage. No mass lesion. No extra-axial collection. No mass effect or midline shift. No hydrocephalus. Basilar cisterns are patent. Vascular: No hyperdense vessel. Skull: No acute fracture or focal lesion. Sinuses/Orbits: Bilateral maxillary mucosal thickening. Otherwise paranasal sinuses and mastoid air cells are clear. The orbits are unremarkable. Other: None. IMPRESSION: No acute intracranial abnormality. Electronically Signed   By: Iven Finn M.D.   On: 04/05/2022 16:27   DG Chest Port 1 View  Result Date: 04/05/2022 CLINICAL DATA:  Syncope and fall. EXAM: PORTABLE CHEST 1 VIEW COMPARISON:  None Available. FINDINGS: The heart size and mediastinal contours are within normal limits. Both lungs are clear. The visualized skeletal structures are unremarkable. IMPRESSION: No active disease. Electronically Signed   By: Titus Dubin M.D.   On: 04/05/2022 18:20    Procedures Procedures    Medications Ordered in ED Medications  0.9 %  sodium chloride infusion (has no administration  in time range)  sodium chloride 0.9 % bolus 1,000 mL (1,000 mLs Intravenous New Bag/Given 04/05/22 1823)    ED Course/ Medical Decision Making/ A&P                           Medical Decision Making Amount and/or Complexity of Data Reviewed Labs: ordered. Radiology: ordered.  Risk Prescription drug management. Decision regarding hospitalization.   Patient with cardiac monitoring here without arrhythmia.  Labs without any significant abnormalities.  Head CT negative no acute findings.  Discussed with Dr. Daneen Schick on-call cardiology and he shared the concern for possible sinus node dysfunction.  Even though we have not been able to capture it because of the story of the heart rate going down into the 30s.  He is recommending hospitalist admission cardiac monitoring and they are going to try to arrange for home monitoring if everything does fine overnight.  Discussed with hospitalist they will admit to telemetry.  Patient has been able to ambulate here without being symptomatic. Final Clinical Impression(s) / ED Diagnoses Final diagnoses:  Syncope, unspecified syncope type    Rx / DC Orders ED Discharge Orders     None         Fredia Sorrow, MD 04/05/22 [phone]

## 2022-04-05 NOTE — ED Notes (Signed)
Report given to [facility] with [facility]

## 2022-04-05 NOTE — ED Notes (Addendum)
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  NAD, VSS and pt is alert and oriented

## 2022-04-05 NOTE — ED Notes (Signed)
Called [facility] to transport patient to [place] room# 14

## 2022-04-05 NOTE — ED Notes (Signed)
Pt is keeping her clothing, cell phone and charger, [facility] badge and she has 4 rings and watch

## 2022-04-06 ENCOUNTER — Observation Stay (HOSPITAL_BASED_OUTPATIENT_CLINIC_OR_DEPARTMENT_OTHER): Payer: 59

## 2022-04-06 ENCOUNTER — Other Ambulatory Visit (HOSPITAL_COMMUNITY): Payer: 59

## 2022-04-06 ENCOUNTER — Observation Stay (HOSPITAL_COMMUNITY): Payer: 59

## 2022-04-06 DIAGNOSIS — R55 Syncope and collapse: Secondary | ICD-10-CM

## 2022-04-06 DIAGNOSIS — S0101XA Laceration without foreign body of scalp, initial encounter: Secondary | ICD-10-CM | POA: Diagnosis not present

## 2022-04-06 LAB — HIV ANTIBODY (ROUTINE TESTING W REFLEX): HIV Screen 4th Generation wRfx: NONREACTIVE

## 2022-04-06 LAB — BASIC METABOLIC PANEL
Anion gap: 5 (ref 5–15)
BUN: 11 mg/dL (ref 6–20)
CO2: 26 mmol/L (ref 22–32)
Calcium: 9.3 mg/dL (ref 8.9–10.3)
Chloride: 110 mmol/L (ref 98–111)
Creatinine, Ser: 0.74 mg/dL (ref 0.44–1.00)
GFR, Estimated: >60 mL/min (ref >60)
Glucose, Bld: 111 mg/dL — ABNORMAL HIGH (ref 70–99)
Potassium: 4.2 mmol/L (ref 3.5–5.1)
Sodium: 141 mmol/L (ref 135–145)

## 2022-04-06 LAB — MRSA NEXT GEN BY PCR, NASAL: MRSA by PCR Next Gen: NOT DETECTED

## 2022-04-06 LAB — CBC
HCT: 37 % (ref 36.0–46.0)
HCT: 36.4 % (ref 36.0–46.0)
Hemoglobin: 12.1 g/dL (ref 12.0–15.0)
Hemoglobin: 12.1 g/dL (ref 12.0–15.0)
MCH: 29.3 pg (ref 26.0–34.0)
MCH: 29.4 pg (ref 26.0–34.0)
MCHC: 32.7 g/dL (ref 30.0–36.0)
MCHC: 33.2 g/dL (ref 30.0–36.0)
MCV: 89.6 fL (ref 80.0–100.0)
MCV: 88.6 fL (ref 80.0–100.0)
Platelets: 268 10*3/uL (ref 150–400)
Platelets: 263 10*3/uL (ref 150–400)
RBC: 4.13 MIL/uL (ref 3.87–5.11)
RBC: 4.11 MIL/uL (ref 3.87–5.11)
RDW: 14.1 % (ref 11.5–15.5)
RDW: 14.1 % (ref 11.5–15.5)
WBC: 10.3 10*3/uL (ref 4.0–10.5)
WBC: 10.5 10*3/uL (ref 4.0–10.5)
nRBC: 0 % (ref 0.0–0.2)
nRBC: 0 % (ref 0.0–0.2)

## 2022-04-06 LAB — ECHOCARDIOGRAM COMPLETE
AR max vel: 3 cm2
AV Peak grad: 6.9 mmHg
Ao pk vel: 1.31 m/s
Area-P 1/2: 3.85 cm2
Calc EF: 62.7 %
Height: 67 in
S' Lateral: 2.8 cm
Single Plane A2C EF: 61 %
Single Plane A4C EF: 63.2 %
Weight: 2444.46 oz

## 2022-04-06 LAB — TSH: TSH: 1.489 u[IU]/mL (ref 0.350–4.500)

## 2022-04-06 LAB — SEDIMENTATION RATE: Sed Rate: 7 mm/hr (ref 0–22)

## 2022-04-06 NOTE — Plan of Care (Signed)

## 2022-04-06 NOTE — Progress Notes (Signed)
Patient discharge instructions reviewed and she verbalized understanding. Written copy given. Cardiology will call her for an appointment. Patient via wheelchair to waiting car to take her home in stable condition. Anson Fret MSN, RN, [employer]

## 2022-04-06 NOTE — Discharge Summary (Signed)
Physician Discharge Summary  Samantha Floyd VHQ:[id number] DOB: [birth year] DOA: 04/05/2022  PCP: Ronnell Freshwater, NP  Admit date: 04/05/2022 Discharge date: 2022-04-18  Admitted From: Home Disposition: Home  Recommendations for Outpatient Follow-up:  Take orthostatic precautions.  Fall precautions.  Monitor for symptoms. Cardiology office will call you for ambulatory heart rate monitoring.  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  She had 2 similar episodes where she had a sensation of dj vu, transient loss of consciousness.  She did hit her head on the floor in 1 of those episodes.  No incontinence, no seizure-like activities or trauma.  Witnessed by fellow medical workers, heart rate reported to be 30, however on monitor there was no bradycardia.  She was taken to the emergency room, had remained stable since then.  She had 1 similar episode last year while she was dehydrated and exhausted.  Monitored in the hospital.  Cardiac telemetry since admission with sinus rhythm and heart rate more than 60.  Negative for orthostatic drop in blood pressure.  No neurological deficits. CT head was essentially normal.  She had some skin contusion. 2D echocardiogram is done, results are pending. EEG was done and that was essentially normal.  Impression plan: Possible vasovagal episode, rule out cardiac arrhythmias.  Patient currently asymptomatic.  She will go home, she will monitor for symptoms.  She will take orthostatic precautions. Referred to cardiology office for ambulatory heart rate monitoring. Stable for discharge.   Discharge Diagnoses:  Principal Problem:   Syncope    Discharge Instructions  Discharge Instructions     Call MD for:   persistant dizziness or light-headedness   Complete by: As directed    Diet general   Complete by: As directed    Discharge instructions   Complete by: As directed    Hydrate yourself well. Report for any recurrent symptoms  Cardiology office will call you to fit with a monitor to check your heart rate   Increase activity slowly   Complete by: As directed       Allergies as of 04-18-2022   No Known Allergies      Medication List     STOP taking these medications    azithromycin 500 MG tablet Commonly known as: ZITHROMAX       TAKE these medications    acetaminophen 500 MG tablet Commonly known as: TYLENOL Take 1,000 mg by mouth every 6 (six) hours as needed for mild pain.   BLACK COHOSH PO Take 40 mg by mouth daily. TAKE ONE   CALCIUM 500 + D3 PO Take 2 tablets by mouth daily.   diphenhydrAMINE 25 MG tablet Commonly known as: BENADRYL 25 mg at bedtime as needed for allergies or sleep.   fluticasone 50 MCG/ACT nasal spray Commonly known as: FLONASE Place 2 sprays into both nostrils daily as needed for allergies.   levocetirizine 5 MG tablet Commonly known as: XYZAL Take 5 mg by mouth daily as needed for allergies.   MULTI ADULT GUMMIES PO Take 1 tablet by mouth daily.   Turmeric 500 MG Caps Take 500 mg by mouth daily.        No Known Allergies  Consultations: None   Procedures/Studies: EEG adult  Result Date: 04-18-2022 Lora Havens, MD     04/18/2022  12:17 PM Patient Name: Samantha Floyd MRN: [id number] Epilepsy Attending: Lora Havens Referring Physician/Provider: Rise Patience, MD Date: 04/06/2022 Duration: 27.10 mins Patient history: 51 year old female with syncope.  EEG to evaluate for seizure. Level of alertness: Awake, asleep AEDs during EEG study: None Technical aspects: This EEG study was done with scalp electrodes positioned according to the 10-20 International system of electrode placement. Electrical activity was acquired at a  sampling rate of '500Hz'$  and reviewed with a high frequency filter of '70Hz'$  and a low frequency filter of '1Hz'$ . EEG data were recorded continuously and digitally stored. Description: The posterior dominant rhythm consists of 10 Hz activity of moderate voltage (25-35 uV) seen predominantly in posterior head regions, symmetric and reactive to eye opening and eye closing. Sleep was characterized by vertex waves, sleep spindles (12 to 14 Hz), maximal frontocentral region. No EEG change was seen during hyperventilation. Photic stimulation was not performed.   IMPRESSION: This study is within normal limits. No seizures or epileptiform discharges were seen throughout the recording. Lora Havens   DG Chest Port 1 View  Result Date: 04/05/2022 CLINICAL DATA:  Syncope and fall. EXAM: PORTABLE CHEST 1 VIEW COMPARISON:  None Available. FINDINGS: The heart size and mediastinal contours are within normal limits. Both lungs are clear. The visualized skeletal structures are unremarkable. IMPRESSION: No active disease. Electronically Signed   By: Titus Dubin M.D.   On: 04/05/2022 18:20   CT Head Wo Contrast  Result Date: 04/05/2022 CLINICAL DATA:  Syncope/presyncope, cerebrovascular cause suspected EXAM: CT HEAD WITHOUT CONTRAST TECHNIQUE: Contiguous axial images were obtained from the base of the skull through the vertex without intravenous contrast. RADIATION DOSE REDUCTION: This exam was performed according to the departmental dose-optimization program which includes automated exposure control, adjustment of the mA and/or kV according to patient size and/or use of iterative reconstruction technique. COMPARISON:  None Available. FINDINGS: Brain: No evidence of large-territorial acute infarction. No parenchymal hemorrhage. No mass lesion. No extra-axial collection. No mass effect or midline shift. No hydrocephalus. Basilar cisterns are patent. Vascular: No hyperdense vessel. Skull: No acute fracture or focal lesion.  Sinuses/Orbits: Bilateral maxillary mucosal thickening. Otherwise paranasal sinuses and mastoid air cells are clear. The orbits are unremarkable. Other: None. IMPRESSION: No acute intracranial abnormality. Electronically Signed   By: Iven Finn M.D.   On: 04/05/2022 16:27   (Echo, Carotid, EGD, Colonoscopy, ERCP)    Subjective: Patient seen and examined in the morning rounds.  Eager to go home.  Denies any complaints.  She tells me she likely had vagal episodes.  Denies any history of cardiac dysrhythmias in the past.   Discharge Exam: Vitals:   04/06/22 0830 04/06/22 1230  BP: 132/87 127/90  Pulse: 86 77  Resp: (!) 23 16  Temp: 98 F (36.7 C) 98.3 F (36.8 C)  SpO2: 96% 97%   Vitals:   04/05/22 2327 04/06/22 0253 04/06/22 0830 04/06/22 1230  BP: 130/89 103/70 132/87 127/90  Pulse: 67 73 86 77  Resp: 11 18 (!) 23 16  Temp:  98 F (36.7 C) 98 F (36.7 C) 98.3 F (36.8 C)  TempSrc:  Oral Oral Oral  SpO2: 99% 98% 96% 97%  Weight:      Height:        General: Pt is alert, awake, not in acute distress Cardiovascular: RRR, S1/S2 +, no rubs, no gallops Respiratory: CTA bilaterally, no wheezing, no rhonchi Abdominal: Soft, NT, ND, bowel sounds + Extremities: no edema, no cyanosis  The results of significant diagnostics from this hospitalization (including imaging, microbiology, ancillary and laboratory) are listed below for reference.     Microbiology: Recent Results (from the past 240 hour(s))  MRSA Next Gen by PCR, Nasal     Status: None   Collection Time: 04/05/22 11:30 PM   Specimen: Nasal Mucosa; Nasal Swab  Result Value Ref Range Status   MRSA by PCR Next Gen NOT DETECTED NOT DETECTED Final    Comment: (NOTE) The GeneXpert MRSA Assay (FDA approved for NASAL specimens only), is one component of a comprehensive MRSA colonization surveillance program. It is not intended to diagnose MRSA infection nor to guide or monitor treatment for MRSA infections. Test  performance is not FDA approved in patients less than 59 years old. Performed at [facility], [place] [address] [place], [place] [address]      Labs: BNP (last 3 results) No results for input(s): "BNP" in the last 8760 hours. Basic Metabolic Panel: Recent Labs  Lab 04/05/22 1609 04/06/22 0217  NA 138 141  K 3.9 4.2  CL 101 110  CO2 28 26  GLUCOSE 112* 111*  BUN 21* 11  CREATININE 0.78 0.74  CALCIUM 10.1 9.3   Liver Function Tests: No results for input(s): "AST", "ALT", "ALKPHOS", "BILITOT", "PROT", "ALBUMIN" in the last 168 hours. No results for input(s): "LIPASE", "AMYLASE" in the last 168 hours. No results for input(s): "AMMONIA" in the last 168 hours. CBC: Recent Labs  Lab 04/05/22 1609 04/06/22 0217  WBC 9.8 10.5  10.3  HGB 12.7 12.1  12.1  HCT 39.4 36.4  37.0  MCV 88.1 88.6  89.6  PLT 304 263  268   Cardiac Enzymes: No results for input(s): "CKTOTAL", "CKMB", "CKMBINDEX", "TROPONINI" in the last 168 hours. BNP: Invalid input(s): "POCBNP" CBG: Recent Labs  Lab 04/05/22 1456  GLUCAP 110*   D-Dimer No results for input(s): "DDIMER" in the last 72 hours. Hgb A1c No results for input(s): "HGBA1C" in the last 72 hours. Lipid Profile No results for input(s): "CHOL", "HDL", "LDLCALC", "TRIG", "CHOLHDL", "LDLDIRECT" in the last 72 hours. Thyroid function studies Recent Labs    04/06/22 0217  TSH 1.489   Anemia work up No results for input(s): "VITAMINB12", "FOLATE", "FERRITIN", "TIBC", "IRON", "RETICCTPCT" in the last 72 hours. Urinalysis    Component Value Date/Time   COLORURINE COLORLESS (A) 04/05/2022 1701   APPEARANCEUR CLEAR 04/05/2022 1701   LABSPEC 1.014 04/05/2022 1701   PHURINE 5.5 04/05/2022 1701   GLUCOSEU NEGATIVE 04/05/2022 1701   HGBUR NEGATIVE 04/05/2022 1701   BILIRUBINUR NEGATIVE 04/05/2022 1701   BILIRUBINUR negative 08/28/2016 1534   KETONESUR NEGATIVE 04/05/2022 1701   PROTEINUR NEGATIVE 04/05/2022 1701    UROBILINOGEN 0.2 08/28/2016 1534   NITRITE NEGATIVE 04/05/2022 1701   LEUKOCYTESUR NEGATIVE 04/05/2022 1701   Sepsis Labs Recent Labs  Lab 04/05/22 1609 04/06/22 0217  WBC 9.8 10.5  10.3   Microbiology Recent Results (from the past 240 hour(s))  MRSA Next Gen by PCR, Nasal     Status: None   Collection Time: 04/05/22 11:30 PM   Specimen: Nasal Mucosa; Nasal Swab  Result Value Ref Range Status   MRSA by PCR Next Gen NOT DETECTED NOT DETECTED Final    Comment: (NOTE) The GeneXpert MRSA Assay (FDA approved for NASAL specimens only), is one component of a comprehensive MRSA colonization surveillance program. It is not intended to diagnose MRSA infection nor to guide or monitor treatment for MRSA infections. Test performance is not  FDA approved in patients less than 55 years old. Performed at [facility], [place] [address], [place], [place] [address]      Time coordinating discharge:  28 minutes  SIGNED:   Barb Merino, MD  [facility] 04/06/2022, 1:23 PM

## 2022-04-06 NOTE — Progress Notes (Signed)
EEG complete - results pending 

## 2022-04-06 NOTE — Procedures (Signed)
Patient Name: Samantha Floyd  MRN: [id number]  Epilepsy Attending: Lora Havens  Referring Physician/Provider: Rise Patience, MD Date: 04/06/2022 Duration: 27.10 mins  Patient history: 51 year old female with syncope.  EEG to evaluate for seizure.  Level of alertness: Awake, asleep  AEDs during EEG study: None  Technical aspects: This EEG study was done with scalp electrodes positioned according to the 10-20 International system of electrode placement. Electrical activity was acquired at a sampling rate of '500Hz'$  and reviewed with a high frequency filter of '70Hz'$  and a low frequency filter of '1Hz'$ . EEG data were recorded continuously and digitally stored.   Description: The posterior dominant rhythm consists of 10 Hz activity of moderate voltage (25-35 uV) seen predominantly in posterior head regions, symmetric and reactive to eye opening and eye closing. Sleep was characterized by vertex waves, sleep spindles (12 to 14 Hz), maximal frontocentral region. No EEG change was seen during hyperventilation. Photic stimulation was not performed.     IMPRESSION: This study is within normal limits. No seizures or epileptiform discharges were seen throughout the recording.  Calin Fantroy Barbra Sarks

## 2022-04-09 NOTE — Progress Notes (Signed)
Echo is good. Discuss with patient at next visit.

## 2022-04-11 ENCOUNTER — Encounter: Payer: Self-pay | Admitting: Nurse Practitioner

## 2022-04-12 ENCOUNTER — Telehealth: Payer: Self-pay

## 2022-04-12 DIAGNOSIS — R55 Syncope and collapse: Secondary | ICD-10-CM

## 2022-04-12 NOTE — Telephone Encounter (Signed)
Left message for patient informing her that a heart monitor is being ordered for 30 days. The heart monitor will be mailed to her home with instructions on how to apply the monitor and send back.   Per Dr. Tamala Julian: Please schedule patient to have a 30-day monitor as part of a work-up for syncope that led to hospital stay.  Heart monitor ordered. Provided office number for callback if patient has any questions or concerns.

## 2022-04-12 NOTE — Telephone Encounter (Signed)
-----   Message from Belva Crome, MD sent at 04/12/2022  4:18 PM EDT ----- Regarding: Ambulatory monitor for syncope. Drue Dun I think we are getting a request for this ambulatory monitor because we are DOD.  Please schedule patient to have a 30-day monitor as part of a work-up for syncope that led to hospital stay.   ----- Message ----- From: Little Ishikawa, MD Sent: 04/12/2022  12:05 PM EDT To: Belva Crome, MD; Ronnell Freshwater, NP; #

## 2022-04-21 ENCOUNTER — Ambulatory Visit (INDEPENDENT_AMBULATORY_CARE_PROVIDER_SITE_OTHER): Payer: 59

## 2022-04-21 DIAGNOSIS — R55 Syncope and collapse: Secondary | ICD-10-CM

## 2022-09-13 ENCOUNTER — Other Ambulatory Visit: Payer: Self-pay | Admitting: Orthopedic Surgery

## 2022-09-13 DIAGNOSIS — M65331 Trigger finger, right middle finger: Secondary | ICD-10-CM | POA: Diagnosis not present

## 2022-10-06 ENCOUNTER — Encounter (HOSPITAL_BASED_OUTPATIENT_CLINIC_OR_DEPARTMENT_OTHER): Payer: Self-pay | Admitting: Orthopedic Surgery

## 2022-10-06 ENCOUNTER — Other Ambulatory Visit: Payer: Self-pay

## 2022-10-16 ENCOUNTER — Other Ambulatory Visit (HOSPITAL_COMMUNITY): Payer: Self-pay

## 2022-10-16 ENCOUNTER — Ambulatory Visit (HOSPITAL_BASED_OUTPATIENT_CLINIC_OR_DEPARTMENT_OTHER)
Admission: RE | Admit: 2022-10-16 | Discharge: 2022-10-16 | Disposition: A | Discharge status: Home / Self Care | Payer: 59 | Attending: Orthopedic Surgery | Admitting: Orthopedic Surgery

## 2022-10-16 ENCOUNTER — Encounter (HOSPITAL_BASED_OUTPATIENT_CLINIC_OR_DEPARTMENT_OTHER)
Admission: RE | Disposition: A | Discharge status: Home / Self Care | Payer: Self-pay | Source: Home / Self Care | Attending: Orthopedic Surgery

## 2022-10-16 ENCOUNTER — Encounter (HOSPITAL_BASED_OUTPATIENT_CLINIC_OR_DEPARTMENT_OTHER): Payer: Self-pay | Admitting: Anesthesiology

## 2022-10-16 ENCOUNTER — Encounter (HOSPITAL_BASED_OUTPATIENT_CLINIC_OR_DEPARTMENT_OTHER): Payer: Self-pay | Admitting: Orthopedic Surgery

## 2022-10-16 DIAGNOSIS — M65331 Trigger finger, right middle finger: Secondary | ICD-10-CM | POA: Diagnosis not present

## 2022-10-16 DIAGNOSIS — Z79899 Other long term (current) drug therapy: Secondary | ICD-10-CM | POA: Insufficient documentation

## 2022-10-16 DIAGNOSIS — M419 Scoliosis, unspecified: Secondary | ICD-10-CM | POA: Insufficient documentation

## 2022-10-16 HISTORY — PX: TRIGGER FINGER RELEASE: SHX641

## 2022-10-16 HISTORY — DX: Trigger finger, unspecified finger: M65.30

## 2022-10-16 HISTORY — DX: Scoliosis, unspecified: M41.9

## 2022-10-16 SURGERY — MINOR RELEASE TRIGGER FINGER/A-1 PULLEY
Anesthesia: LOCAL | Site: Hand | Laterality: Right

## 2022-10-16 MED ORDER — LIDOCAINE HCL (PF) 1 % IJ SOLN
INTRAMUSCULAR | Status: AC
  Filled 2022-10-16: qty 30

## 2022-10-16 MED ORDER — TRAMADOL HCL 50 MG PO TABS
50.0000 mg | ORAL_TABLET | Freq: Four times a day (QID) | ORAL | 0 refills | Status: DC | PRN
  Filled 2022-10-16: qty 15, 4d supply, fill #0

## 2022-10-16 MED ORDER — 0.9 % SODIUM CHLORIDE (POUR BTL) OPTIME
TOPICAL | Status: DC | PRN
  Administered 2022-10-16: 50 mL

## 2022-10-16 MED ORDER — BUPIVACAINE HCL (PF) 0.25 % IJ SOLN
INTRAMUSCULAR | Status: DC | PRN
  Administered 2022-10-16: 6 mL

## 2022-10-16 MED ORDER — LIDOCAINE-EPINEPHRINE (PF) 1 %-1:200000 IJ SOLN
INTRAMUSCULAR | Status: AC
  Filled 2022-10-16: qty 30

## 2022-10-16 MED ORDER — BUPIVACAINE HCL (PF) 0.25 % IJ SOLN
INTRAMUSCULAR | Status: AC
  Filled 2022-10-16: qty 90

## 2022-10-16 SURGICAL SUPPLY — 32 items
APL PRP STRL LF DISP 70% ISPRP (MISCELLANEOUS) ×2
BLADE SURG 15 STRL LF DISP TIS (BLADE) ×6 IMPLANT
BLADE SURG 15 STRL SS (BLADE) ×4
BNDG CMPR 5X2 CHSV 1 LYR STRL (GAUZE/BANDAGES/DRESSINGS) ×2
BNDG CMPR 9X4 STRL LF SNTH (GAUZE/BANDAGES/DRESSINGS) ×2
BNDG COHESIVE 2X5 TAN ST LF (GAUZE/BANDAGES/DRESSINGS) ×3 IMPLANT
BNDG ESMARK 4X9 LF (GAUZE/BANDAGES/DRESSINGS) ×1 IMPLANT
CHLORAPREP W/TINT 26 (MISCELLANEOUS) ×3 IMPLANT
CORD BIPOLAR FORCEPS 12FT (ELECTRODE) ×3 IMPLANT
COVER BACK TABLE 60X90IN (DRAPES) ×3 IMPLANT
COVER MAYO STAND STRL (DRAPES) ×3 IMPLANT
CUFF TOURN SGL QUICK 18X4 (TOURNIQUET CUFF) ×3 IMPLANT
DRAPE EXTREMITY T 121X128X90 (DISPOSABLE) ×3 IMPLANT
DRAPE SURG 17X23 STRL (DRAPES) ×3 IMPLANT
GAUZE SPONGE 4X4 12PLY STRL (GAUZE/BANDAGES/DRESSINGS) ×3 IMPLANT
GAUZE XEROFORM 1X8 LF (GAUZE/BANDAGES/DRESSINGS) ×3 IMPLANT
GLOVE BIO SURGEON STRL SZ7.5 (GLOVE) ×3 IMPLANT
GLOVE BIOGEL PI IND STRL 7.0 (GLOVE) ×2 IMPLANT
GLOVE BIOGEL PI IND STRL 8 (GLOVE) ×3 IMPLANT
GOWN STRL REUS W/ TWL LRG LVL3 (GOWN DISPOSABLE) ×3 IMPLANT
GOWN STRL REUS W/TWL LRG LVL3 (GOWN DISPOSABLE) ×2
GOWN STRL REUS W/TWL XL LVL3 (GOWN DISPOSABLE) ×3 IMPLANT
NDL HYPO 25X1 1.5 SAFETY (NEEDLE) ×2 IMPLANT
NEEDLE HYPO 25X1 1.5 SAFETY (NEEDLE) ×2 IMPLANT
NS IRRIG 1000ML POUR BTL (IV SOLUTION) ×3 IMPLANT
PACK BASIN DAY SURGERY FS (CUSTOM PROCEDURE TRAY) ×3 IMPLANT
STOCKINETTE 4X48 STRL (DRAPES) ×3 IMPLANT
SUT ETHILON 4 0 PS 2 18 (SUTURE) ×3 IMPLANT
SYR BULB EAR ULCER 3OZ GRN STR (SYRINGE) ×3 IMPLANT
SYR CONTROL 10ML LL (SYRINGE) ×3 IMPLANT
TOWEL GREEN STERILE FF (TOWEL DISPOSABLE) ×6 IMPLANT
UNDERPAD 30X36 HEAVY ABSORB (UNDERPADS AND DIAPERS) ×3 IMPLANT

## 2022-10-16 NOTE — Op Note (Signed)
10/16/2022 [facility]  Operative Note  PREOPERATIVE DIAGNOSIS: RIGHT MIDDLE FINGER TRIGGER  POSTOPERATIVE DIAGNOSIS:  RIGHT MIDDLE FINGER TRIGGER  PROCEDURE: Procedure(s): RELEASE TRIGGER FINGER/A-1 PULLEY RIGHT MIDDLE FINGER  SURGEON:  [name], MD  ASSISTANT:  none.  ANESTHESIA:  Local.  IV FLUIDS:  Per anesthesia flow sheet.  ESTIMATED BLOOD LOSS:  Minimal.  COMPLICATIONS:  None.  SPECIMENS:  None.  TOURNIQUET TIME:  Total Tourniquet Time Documented: Forearm (Right) - 10 minutes Total: Forearm (Right) - 10 minutes   DISPOSITION:  Stable to PACU.  LOCATION: [facility]  INDICATIONS: Samantha Floyd is a 51 y.o. female with triggering right long finger.  She wishes to have right long finger trigger release.  Risks, benefits and alternatives of surgery were discussed including the risk of blood loss, infection, damage to nerves, vessels, tendons, ligaments, bone, failure of surgery, need for additional surgery, complications with wound healing, continued pain, continued triggering and need for repeat surgery.  She voiced understanding of these risks and elected to proceed.  OPERATIVE COURSE:  After being identified preoperatively by myself, the patient and I agreed upon the procedure and site of procedure.  The surgical site was marked. Surgical consent had been signed. She was transported to the operating room and placed on the operating room table in supine position with the Right upper extremity on an arm board. A surgical pause was performed between surgeon, patient, and operating room staff, and all were in agreement as to the patient, procedure, and site of procedure. The surgical field was injected with 0.25% plain marcaine. The Right upper extremity was prepped and draped in normal sterile orthopedic fashion. A surgical pause was performed between surgeon, patient, and operating room staff, and all were in agreement as to the patient,  procedure, and site of procedure.  Tourniquet at the proximal aspect of the forearm was inflated to 250 mmHg after exsanguination of the arm with an Esmarch bandage.  An incision was made at the volar aspect of the MP joint of the long finger.  This was carried into the subcutaneous tissues by spreading technique.  Bipolar electrocautery was used to obtain hemostasis.  The radial and ulnar digital nerves were protected throughout the case. The flexor sheath was identified.  The A1 pulley was identified and sharply incised.  It was released in its entirety.  The proximal 1-2 mm of the A2 pulley was vented to allow better excursion of the tendons.  The finger was placed through a range of motion and there was noted to be no catching.  The tendons were brought through the wound and any adherences released.  The wound was then copiously irrigated with sterile saline. It was closed with 4-0 nylon in a horizontal mattress fashion.  It was injected with 0.25% plain Marcaine to aid in postoperative analgesia.  It was dressed with sterile Xeroform, 4x4s, and wrapped lightly with a Coban dressing.  Tourniquet was deflated at 10 minutes.  The fingertips were pink with brisk capillary refill after deflation of the tourniquet.  The operative drapes were broken down and the patient was awoken from anesthesia safely.  She was transferred back to the stretcher and taken to the PACU in stable condition.   I will see her back in the office in 1 week for postoperative followup.  I will give her a prescription for Tramadol 50 mg 1 tab PO q6 hours prn pain, dispense #15.    [name], MD Electronically signed, 10/16/22

## 2022-10-16 NOTE — Discharge Instructions (Signed)

## 2022-10-16 NOTE — H&P (Signed)
  Samantha Floyd is an 51 y.o. female.   Chief Complaint: trigger digit HPI: 51 yo female with triggering right long finger.  It is bothersome to her.  She wishes to have surgical release.  Allergies: No Known Allergies  Past Medical History:  Diagnosis Date   Allergy    SEASONAL   Scoliosis    Trigger finger    right middle finger    Past Surgical History:  Procedure Laterality Date   ADENOIDECTOMY  10/30/1974   LASIK     1999    Family History: Family History  Problem Relation Age of Onset   Hypertension Mother    Cancer Father    Hypertension Father    Heart disease Maternal Grandmother    Cancer Maternal Grandfather    Stroke Maternal Grandfather    Heart disease Paternal Grandmother    Hyperlipidemia Paternal Grandmother    Hypertension Paternal Grandmother    Stroke Paternal Grandmother    Diabetes Paternal Grandfather    Heart disease Paternal Grandfather    Hypertension Paternal Grandfather    Breast cancer Neg Hx    Colon cancer Neg Hx    Colon polyps Neg Hx    Esophageal cancer Neg Hx    Rectal cancer Neg Hx    Stomach cancer Neg Hx     Social History:   reports that she has never smoked. She has never used smokeless tobacco. She reports that she does not drink alcohol and does not use drugs.  Medications: Medications Prior to Admission  Medication Sig Dispense Refill   acetaminophen (TYLENOL) 500 MG tablet Take 1,000 mg by mouth every 6 (six) hours as needed for mild pain.     BLACK COHOSH PO Take 40 mg by mouth daily. TAKE ONE     diphenhydrAMINE (BENADRYL) 25 MG tablet 25 mg at bedtime as needed for allergies or sleep.     fluticasone (FLONASE) 50 MCG/ACT nasal spray Place 2 sprays into both nostrils daily as needed for allergies.     ibuprofen (ADVIL) 200 MG tablet Take 400 mg by mouth in the morning and at bedtime.     levocetirizine (XYZAL) 5 MG tablet Take 5 mg by mouth daily as needed for allergies.     Multiple Vitamins-Minerals (MULTI  ADULT GUMMIES PO) Take 1 tablet by mouth daily.     Calcium Carb-Cholecalciferol (CALCIUM 500 + D3 PO) Take 2 tablets by mouth daily.      No results found for this or any previous visit (from the past 48 hour(s)).  No results found.    Blood pressure 115/84, pulse 85, temperature 98.5 F (36.9 C), temperature source Oral, resp. rate 16, height '5\' 7"'$  (1.702 m), weight 72.6 kg, last menstrual period 12/28/2021, SpO2 100 %.  General appearance: alert, cooperative, and appears stated age Head: Normocephalic, without obvious abnormality, atraumatic Neck: supple, symmetrical, trachea midline Extremities: Intact sensation and capillary refill all digits.  +epl/fpl/io.  No wounds.  Pulses: 2+ and symmetric Skin: Skin color, texture, turgor normal. No rashes or lesions Neurologic: Grossly normal Incision/Wound: none  Assessment/Plan Right long finger trigger digit.  Non operative and operative treatment options have been discussed with the patient and patient wishes to proceed with operative treatment. Risks, benefits, and alternatives of surgery have been discussed and the patient agrees with the plan of care.   Samantha Floyd 10/16/2022, 1:10 PM

## 2022-10-18 ENCOUNTER — Encounter (HOSPITAL_BASED_OUTPATIENT_CLINIC_OR_DEPARTMENT_OTHER): Payer: Self-pay | Admitting: Orthopedic Surgery

## 2022-11-09 ENCOUNTER — Other Ambulatory Visit: Payer: Self-pay | Admitting: Nurse Practitioner

## 2022-11-09 DIAGNOSIS — Z1231 Encounter for screening mammogram for malignant neoplasm of breast: Secondary | ICD-10-CM

## 2022-11-24 ENCOUNTER — Ambulatory Visit
Admission: RE | Admit: 2022-11-24 | Discharge: 2022-11-24 | Disposition: A | Discharge status: Home / Self Care | Payer: Commercial Managed Care - PPO | Source: Ambulatory Visit | Attending: Nurse Practitioner | Admitting: Nurse Practitioner

## 2022-11-24 DIAGNOSIS — Z1231 Encounter for screening mammogram for malignant neoplasm of breast: Secondary | ICD-10-CM

## 2022-11-28 NOTE — Progress Notes (Signed)
Additional images requested

## 2022-11-29 ENCOUNTER — Other Ambulatory Visit: Payer: Self-pay | Admitting: Nurse Practitioner

## 2022-11-29 DIAGNOSIS — R928 Other abnormal and inconclusive findings on diagnostic imaging of breast: Secondary | ICD-10-CM

## 2022-12-14 ENCOUNTER — Ambulatory Visit
Admission: RE | Admit: 2022-12-14 | Discharge: 2022-12-14 | Disposition: A | Discharge status: Home / Self Care | Payer: Commercial Managed Care - PPO | Source: Ambulatory Visit | Attending: Nurse Practitioner | Admitting: Nurse Practitioner

## 2022-12-14 ENCOUNTER — Ambulatory Visit: Payer: Commercial Managed Care - PPO

## 2022-12-14 DIAGNOSIS — R928 Other abnormal and inconclusive findings on diagnostic imaging of breast: Secondary | ICD-10-CM

## 2022-12-22 DIAGNOSIS — H5213 Myopia, bilateral: Secondary | ICD-10-CM | POA: Diagnosis not present

## 2023-01-08 ENCOUNTER — Telehealth: Payer: Commercial Managed Care - PPO | Admitting: Physician Assistant

## 2023-01-08 ENCOUNTER — Other Ambulatory Visit (HOSPITAL_COMMUNITY): Payer: Self-pay

## 2023-01-08 DIAGNOSIS — L237 Allergic contact dermatitis due to plants, except food: Secondary | ICD-10-CM

## 2023-01-08 MED ORDER — TRIAMCINOLONE ACETONIDE 0.1 % EX CREA
1.0000 | TOPICAL_CREAM | Freq: Two times a day (BID) | CUTANEOUS | 0 refills | Status: DC
  Filled 2023-01-08: qty 30, 15d supply, fill #0

## 2023-01-08 NOTE — Progress Notes (Signed)
E-Visit for [employer]  We are sorry that you are not feeing well.  Here is how we plan to help!  Based on what you have shared with me it looks like you have had an allergic reaction to the oily resin from a group of plants.  This resin is very sticky, so it easily attaches to your skin, clothing, tools equipment, and pet's fur.    This blistering rash is often called poison ivy rash although it can come from contact with the leaves, stems and roots of poison ivy, poison oak and poison sumac.  The oily resin contains urushiol (u-ROO-she-ol) that produces a skin rash on exposed skin.  The severity of the rash depends on the amount of urushiol that gets on your skin.  A section of skin with more urushiol on it may develop a rash sooner.  The rash usually develops 12-48 hours after exposure and can last two to three weeks.  Your skin must come in direct contact with the plant's oil to be affected.  Blister fluid doesn't spread the rash.  However, if you come into contact with a piece of clothing or pet fur that has urushiol on it, the rash may spread out.  You can also transfer the oil to other parts of your body with your fingers.  Often the rash looks like a straight line because of the way the plant brushes against your skin.  Most poison ivy treatments are usually limited to self-care methods. Small areas of the rash will typically go away on its own in two to three weeks.  Since your rash is limited, I am recommending that you follow these recommendations:  Make sure that the clothes you were wearing and any towels or sheets that may have come in contact with the oil (urushiol) are washed in detergent and hot water.  Apply Benadryl or Caladryl lotion to the rash.  You may apply these as often as needed to control the itching.  Cool baths also often help with itching. Take oral antihistamines, such as diphenhydramine (Benadryl, others), which may also help you sleep better.  Soak in a cool-water bath  containing an oatmeal-based bath product (Aveeno).  Place Cool, wet compresses on the affected area for 15 to 30 minutes several times a day.  Avoid a hot shower or bath as this may increase your itching.     I have developed the following plan to treat your condition I have prescribed Triamcinolone cream 1% apply topically 2-3 times daily for 10-14 days.    What can you do to prevent this rash?  Avoid the plants.  Learn how to identify poison ivy, poison oak and poison sumac in all seasons.  When hiking or engaging in other activities that might expose you to these plants, try to stay on cleared pathways.  If camping, make sure you pitch your tent in an area free of these plants.  Keep pets from running through wooded areas so that urushiol doesn't accidentally stick to their fur, which you may touch.  Remove or kill the plants.  In your yard, you can get rid of poison ivy by applying an herbicide or pulling it out of the ground, including the roots, while wearing heavy gloves.  Afterward remove the gloves and thoroughly wash them and your hands.  Don't burn poison ivy or related plants because the urushiol can be carried by smoke.  Wear protective clothing.  If needed, protect your skin by wearing socks, boots,  pants, long sleeves and vinyl gloves.  Wash your skin right away.  Washing off the oil with soap and water within 30 minutes of exposure may reduce your chances of getting a poison ivy rash.  Even washing after an hour or so can help reduce the severity of the rash.  If you walk through some poison ivy and then later touch your shoes, you may get some urushiol on your hands, which may then transfer to your face or body by touching or rubbing.  If the contaminated object isn't cleaned, the urushiol on it can still cause a skin reaction years later.    Be careful not to reuse towels after you have washed your skin.  Also carefully wash clothing in detergent and hot water to remove all traces  of the oil.  Handle contaminated clothing carefully so you don't transfer the urushiol to yourself, furniture, rugs or appliances.  Remember that pets can carry the oil on their fur and paws.  If you think your pet may be contaminated with urushiol, put on some long rubber gloves and give your pet a bath.  Finally, be careful not to burn these plants as the smoke can contain traces of the oil.  Inhaling the smoke may result in difficulty breathing. If that occurred you should see a physician as soon as possible.  See your doctor right away if:  The reaction is severe or widespread You inhaled the smoke from burning poison ivy and are having difficulty breathing Your skin continues to swell The rash affects your eyes, mouth or genitals Blisters are oozing pus You develop a fever greater than 100 F (37.8 C) The rash doesn't get better within a few weeks.  If you scratch the poison ivy rash, bacteria under your fingernails may cause the skin to become infected.  See your doctor if pus starts oozing from the blisters.  Treatment generally includes antibiotics.  Poison ivy treatments are usually limited to self-care methods.  And the rash typically goes away on its own in two to three weeks.     If the rash is widespread or results in a large number of blisters, your doctor may prescribe an oral corticosteroid, such as prednisone.  If a bacterial infection has developed at the rash site, your doctor may give you a prescription for an oral antibiotic.  MAKE SURE YOU  Understand these instructions. Will watch your condition. Will get help right away if you are not doing well or get worse.   Thank you for choosing an e-visit.  Your e-visit answers were reviewed by a board certified advanced clinical practitioner to complete your personal care plan. Depending upon the condition, your plan could have included both over the counter or prescription medications.  Please review your pharmacy  choice. Make sure the pharmacy is open so you can pick up prescription now. If there is a problem, you may contact your provider through [employer] and have the prescription routed to another pharmacy.  Your safety is important to [place]. If you have drug allergies check your prescription carefully.   For the next 24 hours you can use MyChart to ask questions about today's visit, request a non-urgent call back, or ask for a work or school excuse. You will get an email in the next two days asking about your experience. I hope that your e-visit has been valuable and will speed your recovery.   I have spent 5 minutes in review of e-visit questionnaire, review and  updating patient chart, medical decision making and response to patient.   Mar Daring, PA-C

## 2023-02-01 ENCOUNTER — Other Ambulatory Visit: Payer: Self-pay

## 2023-02-01 DIAGNOSIS — Z130 Encounter for screening for diseases of the blood and blood-forming organs and certain disorders involving the immune mechanism: Secondary | ICD-10-CM

## 2023-02-01 DIAGNOSIS — R7301 Impaired fasting glucose: Secondary | ICD-10-CM

## 2023-02-01 DIAGNOSIS — R5383 Other fatigue: Secondary | ICD-10-CM

## 2023-02-01 DIAGNOSIS — Z0000 Encounter for general adult medical examination without abnormal findings: Secondary | ICD-10-CM

## 2023-02-01 DIAGNOSIS — E785 Hyperlipidemia, unspecified: Secondary | ICD-10-CM

## 2023-02-01 DIAGNOSIS — D509 Iron deficiency anemia, unspecified: Secondary | ICD-10-CM

## 2023-02-05 ENCOUNTER — Other Ambulatory Visit: Payer: Commercial Managed Care - PPO

## 2023-02-05 DIAGNOSIS — Z130 Encounter for screening for diseases of the blood and blood-forming organs and certain disorders involving the immune mechanism: Secondary | ICD-10-CM | POA: Diagnosis not present

## 2023-02-05 DIAGNOSIS — R7301 Impaired fasting glucose: Secondary | ICD-10-CM | POA: Diagnosis not present

## 2023-02-05 DIAGNOSIS — E785 Hyperlipidemia, unspecified: Secondary | ICD-10-CM | POA: Diagnosis not present

## 2023-02-05 DIAGNOSIS — D509 Iron deficiency anemia, unspecified: Secondary | ICD-10-CM | POA: Diagnosis not present

## 2023-02-05 DIAGNOSIS — R5383 Other fatigue: Secondary | ICD-10-CM | POA: Diagnosis not present

## 2023-02-05 DIAGNOSIS — Z13228 Encounter for screening for other metabolic disorders: Secondary | ICD-10-CM | POA: Diagnosis not present

## 2023-02-05 DIAGNOSIS — Z1329 Encounter for screening for other suspected endocrine disorder: Secondary | ICD-10-CM | POA: Diagnosis not present

## 2023-02-05 DIAGNOSIS — Z0000 Encounter for general adult medical examination without abnormal findings: Secondary | ICD-10-CM

## 2023-02-05 DIAGNOSIS — Z1321 Encounter for screening for nutritional disorder: Secondary | ICD-10-CM | POA: Diagnosis not present

## 2023-02-06 LAB — CBC
Hematocrit: 35.7 % (ref 34.0–46.6)
Hemoglobin: 11.7 g/dL (ref 11.1–15.9)
MCH: 29.4 pg (ref 26.6–33.0)
MCHC: 32.8 g/dL (ref 31.5–35.7)
MCV: 90 fL (ref 79–97)
Platelets: 258 10*3/uL (ref 150–450)
RBC: 3.98 x10E6/uL (ref 3.77–5.28)
RDW: 13.1 % (ref 11.7–15.4)
WBC: 5.1 10*3/uL (ref 3.4–10.8)

## 2023-02-06 LAB — COMPREHENSIVE METABOLIC PANEL
ALT: 21 IU/L (ref 0–32)
AST: 31 IU/L (ref 0–40)
Albumin/Globulin Ratio: 1.7 (ref 1.2–2.2)
Albumin: 4.2 g/dL (ref 3.8–4.9)
Alkaline Phosphatase: 112 IU/L (ref 44–121)
BUN/Creatinine Ratio: 22 (ref 9–23)
BUN: 17 mg/dL (ref 6–24)
Bilirubin Total: 0.2 mg/dL (ref 0.0–1.2)
CO2: 18 mmol/L — ABNORMAL LOW (ref 20–29)
Calcium: 9.5 mg/dL (ref 8.7–10.2)
Chloride: 107 mmol/L — ABNORMAL HIGH (ref 96–106)
Creatinine, Ser: 0.77 mg/dL (ref 0.57–1.00)
Globulin, Total: 2.5 g/dL (ref 1.5–4.5)
Glucose: 88 mg/dL (ref 70–99)
Potassium: 4.4 mmol/L (ref 3.5–5.2)
Sodium: 145 mmol/L — ABNORMAL HIGH (ref 134–144)
Total Protein: 6.7 g/dL (ref 6.0–8.5)
eGFR: 92 mL/min/{1.73_m2} (ref >59)

## 2023-02-06 LAB — HEMOGLOBIN A1C
Est. average glucose Bld gHb Est-mCnc: 126 mg/dL
Hgb A1c MFr Bld: 6 % — ABNORMAL HIGH (ref 4.8–5.6)

## 2023-02-06 LAB — LIPID PANEL
Chol/HDL Ratio: 3.8 ratio (ref 0.0–4.4)
Cholesterol, Total: 211 mg/dL — ABNORMAL HIGH (ref 100–199)
HDL: 56 mg/dL (ref >39)
LDL Chol Calc (NIH): 136 mg/dL — ABNORMAL HIGH (ref 0–99)
Triglycerides: 105 mg/dL (ref 0–149)
VLDL Cholesterol Cal: 19 mg/dL (ref 5–40)

## 2023-02-06 LAB — TSH: TSH: 4.87 u[IU]/mL — ABNORMAL HIGH (ref 0.450–4.500)

## 2023-02-13 ENCOUNTER — Encounter: Payer: Self-pay | Admitting: Family Medicine

## 2023-02-13 ENCOUNTER — Ambulatory Visit (INDEPENDENT_AMBULATORY_CARE_PROVIDER_SITE_OTHER): Payer: Commercial Managed Care - PPO | Admitting: Family Medicine

## 2023-02-13 ENCOUNTER — Other Ambulatory Visit (HOSPITAL_COMMUNITY): Payer: Self-pay

## 2023-02-13 ENCOUNTER — Other Ambulatory Visit (HOSPITAL_COMMUNITY)
Admission: RE | Admit: 2023-02-13 | Discharge: 2023-02-13 | Disposition: A | Discharge status: Home / Self Care | Payer: Commercial Managed Care - PPO | Source: Ambulatory Visit | Attending: Family Medicine | Admitting: Family Medicine

## 2023-02-13 VITALS — BP 120/79 | HR 86 | Resp 18 | Ht 67.0 in | Wt 161.0 lb

## 2023-02-13 DIAGNOSIS — R7989 Other specified abnormal findings of blood chemistry: Secondary | ICD-10-CM | POA: Diagnosis not present

## 2023-02-13 DIAGNOSIS — R7301 Impaired fasting glucose: Secondary | ICD-10-CM | POA: Diagnosis not present

## 2023-02-13 DIAGNOSIS — Z124 Encounter for screening for malignant neoplasm of cervix: Secondary | ICD-10-CM

## 2023-02-13 DIAGNOSIS — M419 Scoliosis, unspecified: Secondary | ICD-10-CM

## 2023-02-13 DIAGNOSIS — E785 Hyperlipidemia, unspecified: Secondary | ICD-10-CM | POA: Diagnosis not present

## 2023-02-13 DIAGNOSIS — R87618 Other abnormal cytological findings on specimens from cervix uteri: Secondary | ICD-10-CM

## 2023-02-13 DIAGNOSIS — Z0000 Encounter for general adult medical examination without abnormal findings: Secondary | ICD-10-CM | POA: Diagnosis not present

## 2023-02-13 DIAGNOSIS — N951 Menopausal and female climacteric states: Secondary | ICD-10-CM | POA: Diagnosis not present

## 2023-02-13 DIAGNOSIS — R5383 Other fatigue: Secondary | ICD-10-CM

## 2023-02-13 MED ORDER — ESTRADIOL-NORETHINDRONE ACET 0.5-0.1 MG PO TABS
1.0000 | ORAL_TABLET | Freq: Every day | ORAL | 12 refills | Status: DC
  Filled 2023-02-13: qty 28, 28d supply, fill #0
  Filled 2023-03-09: qty 28, 28d supply, fill #1

## 2023-02-13 NOTE — Progress Notes (Signed)
Complete physical exam  Patient: Samantha Floyd   DOB: 25-Sep-1971   52 y.o. Female  MRN: [id number]  Subjective:    Chief Complaint  Patient presents with   Annual Exam    Samantha Floyd is a 52 y.o. female who presents today for a complete physical exam. She reports consuming a general balanced diet.  The patient is very active and on her feet all day at work but also makes a point to walk at least twice a week for 45 minutes each time.  She generally feels well. She reports sleeping poorly which is associated with menopausal symptoms. She does have additional problems to discuss today.  She would like a referral to see Dr. Dutch Quint at [facility] because she has a history of scoliosis.  Additionally, she would like to initiate hormone replacement therapy to help with her menopausal symptoms, mood, and insomnia.  Most recent fall risk assessment:    02/13/2023    1:59 PM  Fall Risk   Falls in the past year? 0  Number falls in past yr: 0  Injury with Fall? 0  Risk for fall due to : No Fall Risks     Most recent depression screenings:    02/13/2023    1:58 PM 09/27/2021    9:13 AM  PHQ 2/9 Scores  PHQ - 2 Score 2 0  PHQ- 9 Score 6 4    Patient Active Problem List   Diagnosis Date Noted   Vasomotor symptoms due to menopause 02/13/2023   Scoliosis 02/13/2023   Dyslipidemia, goal LDL below 100 09/27/2021   Impaired fasting glucose 09/27/2021   Iron deficiency anemia 09/27/2021   Perimenopausal 08/02/2019    Past Surgical History:  Procedure Laterality Date   ADENOIDECTOMY  10/30/1974   LASIK     1999   TRIGGER FINGER RELEASE Right 10/16/2022   Procedure: RELEASE TRIGGER FINGER/A-1 PULLEY RIGHT MIDDLE FINGER;  Surgeon: Betha Loa, MD;  Location: [facility];  Service: Orthopedics;  Laterality: Right;   Social History   Tobacco Use   Smoking status: Never    Passive exposure: Never   Smokeless tobacco: Never  Vaping Use   Vaping Use: Never used   Substance Use Topics   Alcohol use: No    Alcohol/week: 0.0 standard drinks of alcohol   Drug use: No   Family History  Problem Relation Age of Onset   Hypertension Mother    Cancer Father    Hypertension Father    Heart disease Maternal Grandmother    Cancer Maternal Grandfather    Stroke Maternal Grandfather    Heart disease Paternal Grandmother    Hyperlipidemia Paternal Grandmother    Hypertension Paternal Grandmother    Stroke Paternal Grandmother    Diabetes Paternal Grandfather    Heart disease Paternal Grandfather    Hypertension Paternal Grandfather    Breast cancer Neg Hx    Colon cancer Neg Hx    Colon polyps Neg Hx    Esophageal cancer Neg Hx    Rectal cancer Neg Hx    Stomach cancer Neg Hx    No Known Allergies   Patient Care Team: Melida Quitter, PA as PCP - General (Family Medicine)   Outpatient Medications Prior to Visit  Medication Sig   acetaminophen (TYLENOL) 500 MG tablet Take 1,000 mg by mouth every 6 (six) hours as needed for mild pain.   BLACK COHOSH PO Take 40 mg by mouth daily. TAKE ONE  fluticasone (FLONASE) 50 MCG/ACT nasal spray Place 2 sprays into both nostrils daily as needed for allergies.   ibuprofen (ADVIL) 200 MG tablet Take 400 mg by mouth in the morning and at bedtime.   levocetirizine (XYZAL) 5 MG tablet Take 5 mg by mouth daily as needed for allergies.   Multiple Vitamins-Minerals (MULTI ADULT GUMMIES PO) Take 1 tablet by mouth daily.   naproxen sodium (ALEVE) 220 MG tablet Take 220 mg by mouth every 12 (twelve) hours as needed (Back pain).   triamcinolone cream (KENALOG) 0.1 % Apply 1 Application topically 2 (two) times daily.   [DISCONTINUED] Calcium Carb-Cholecalciferol (CALCIUM 500 + D3 PO) Take 2 tablets by mouth daily. (Patient not taking: Reported on 02/13/2023)   [DISCONTINUED] traMADol (ULTRAM) 50 MG tablet Take 1 tablet (50 mg total) by mouth every 6 (six) hours as needed for pain (Patient not taking: Reported on  02/13/2023)   No facility-administered medications prior to visit.    Review of Systems  Constitutional:  Negative for chills, fever and malaise/fatigue.  HENT:  Negative for congestion, ear pain and hearing loss.   Eyes:  Negative for blurred vision and double vision.  Respiratory:  Negative for cough and shortness of breath.   Cardiovascular:  Negative for chest pain, palpitations and leg swelling.  Gastrointestinal:  Negative for abdominal pain, constipation, diarrhea and heartburn.  Genitourinary:  Negative for frequency and urgency.  Musculoskeletal:  Positive for back pain (Related to scoliosis). Negative for myalgias and neck pain.  Neurological:  Negative for dizziness and headaches.  Endo/Heme/Allergies:  Negative for polydipsia.  Psychiatric/Behavioral:  Positive for depression. The patient has insomnia. The patient is not nervous/anxious.      Objective:    BP 120/79 (BP Location: Left Arm, Patient Position: Sitting, Cuff Size: Normal)   Pulse 86   Resp 18   Ht  (1.702 m)   Wt 161 lb (73 kg)   SpO2 99%   BMI 25.22 kg/m    Physical Exam Exam conducted with a chaperone present.  Constitutional:      General: She is not in acute distress.    Appearance: Normal appearance.  HENT:     Head: Normocephalic and atraumatic.     Right Ear: Tympanic membrane and ear canal normal.     Left Ear: Tympanic membrane and ear canal normal.     Nose: Nose normal.     Mouth/Throat:     Mouth: Mucous membranes are moist.     Pharynx: No oropharyngeal exudate or posterior oropharyngeal erythema.  Eyes:     Extraocular Movements: Extraocular movements intact.     Pupils: Pupils are equal, round, and reactive to light.  Neck:     Thyroid: No thyromegaly or thyroid tenderness.  Cardiovascular:     Rate and Rhythm: Normal rate and regular rhythm.     Heart sounds: Normal heart sounds.  Pulmonary:     Effort: Pulmonary effort is normal.     Breath sounds: Normal breath  sounds.  Abdominal:     General: Abdomen is flat. Bowel sounds are normal.     Hernia: There is no hernia in the left inguinal area or right inguinal area.  Genitourinary:    Exam position: Lithotomy position.     Pubic Area: No rash or pubic lice.      Tanner stage (genital): 5.     Labia:        Right: No rash, tenderness, lesion or injury.  Left: No rash, tenderness, lesion or injury.      Urethra: No prolapse or urethral swelling.     Vagina: No vaginal discharge, erythema, tenderness or bleeding.     Cervix: No friability or erythema.     Uterus: Normal.      Adnexa:        Right: No mass, tenderness or fullness.         Left: No mass, tenderness or fullness.       Rectum: No anal fissure or external hemorrhoid.  Musculoskeletal:        General: Normal range of motion.     Cervical back: Normal range of motion and neck supple. No tenderness.  Lymphadenopathy:     Cervical: No cervical adenopathy.     Lower Body: No right inguinal adenopathy. No left inguinal adenopathy.  Skin:    General: Skin is warm and dry.  Neurological:     Mental Status: She is alert and oriented to person, place, and time.  Psychiatric:        Mood and Affect: Mood normal.       Assessment & Plan:    Routine Health Maintenance and Physical Exam  Immunization History  Administered Date(s) Administered   Influenza Split 08/09/2015   Influenza-Unspecified 07/30/2016, 07/26/2018, 08/04/2020   PFIZER(Purple Top)SARS-COV-2 Vaccination 11/06/2019, 11/28/2019   Tdap 10/08/2015   Zoster Recombinat (Shingrix) 06/25/2020, 09/30/2020    Health Maintenance  Topic Date Due   Hepatitis C Screening  Never done   PAP SMEAR-Modifier  03/13/2022   COVID-19 Vaccine (3 - 2023-24 season) 06/30/2022   INFLUENZA VACCINE  05/31/2023   MAMMOGRAM  11/24/2024   DTaP/Tdap/Td (2 - Td or Tdap) 10/07/2025   COLONOSCOPY (Pts 45-75yrs Insurance coverage will need to be confirmed)  12/05/2028   HIV Screening   Completed   Zoster Vaccines- Shingrix  Completed   HPV VACCINES  Aged Out    Discussed health benefits of physical activity, and encouraged her to engage in regular exercise appropriate for her age and condition.  Wellness examination  Dyslipidemia, goal LDL below 100 Assessment & Plan: Last lipid panel: LDL 136, HDL 56, triglycerides 105. The 10-year ASCVD risk score (Arnett DK, et al., 2019) is: 1.5%.  Will manage with lifestyle changes including a heart healthy diet low in fat and regular intentional movement.  Will continue to monitor.   Impaired fasting glucose Assessment & Plan: A1c 6.0, increased from last check at 5.6.  We will continue to monitor this.  Encourage patient to remain active and to be mindful of her intake of sugary foods and simple carbohydrates.   Elevated TSH -     TSH; Future -     T4, free; Future  Cervical cancer screening -     Cytology - PAP  Screening for cervical cancer  Vasomotor symptoms due to menopause Assessment & Plan: We discussed management options including hormone replacement therapy, Veozah, and Paxil and patient opted for hormone replacement therapy.  She still has a uterus present so combination estrogen and progesterone is indicated.  We discussed common side effects as well as more serious side effects including breast and GYN cancers and blood clots.  Will follow-up in about 3 months to assess efficacy and make any adjustments necessary.  Patient verbalized understanding and is agreeable with this plan.  Orders: -     Estradiol-Norethindrone Acet; Take 1 tablet by mouth daily.  Dispense: 30 tablet; Refill: 12  Scoliosis, unspecified scoliosis type, unspecified spinal  region Assessment & Plan: Patient specifically requested referral to Dr. Dutch Quint at [facility].  Orders: -     Ambulatory referral to Orthopedic Surgery  Rechecking TSH and T4 since TSH was elevated on last check.  Will continue to monitor cholesterol levels and  A1c.  Pap completed today, if normal will not need repeat for 5 years.  Return in about 3 months (around 05/15/2023) for follow-up for HRT.     Melida Quitter, PA

## 2023-02-13 NOTE — Assessment & Plan Note (Signed)
We discussed management options including hormone replacement therapy, Veozah, and Paxil and patient opted for hormone replacement therapy.  She still has a uterus present so combination estrogen and progesterone is indicated.  We discussed common side effects as well as more serious side effects including breast and GYN cancers and blood clots.  Will follow-up in about 3 months to assess efficacy and make any adjustments necessary.  Patient verbalized understanding and is agreeable with this plan.

## 2023-02-13 NOTE — Assessment & Plan Note (Signed)
A1c 6.0, increased from last check at 5.6.  We will continue to monitor this.  Encourage patient to remain active and to be mindful of her intake of sugary foods and simple carbohydrates.

## 2023-02-13 NOTE — Assessment & Plan Note (Signed)
Last lipid panel: LDL 136, HDL 56, triglycerides 105. The 10-year ASCVD risk score (Arnett DK, et al., 2019) is: 1.5%.  Will manage with lifestyle changes including a heart healthy diet low in fat and regular intentional movement.  Will continue to monitor.

## 2023-02-13 NOTE — Assessment & Plan Note (Signed)
Patient specifically requested referral to Dr. Dutch Quint at [facility].

## 2023-02-14 LAB — TSH: TSH: 4.43 u[IU]/mL (ref 0.450–4.500)

## 2023-02-14 LAB — T4, FREE: Free T4: 0.94 ng/dL (ref 0.82–1.77)

## 2023-02-19 LAB — CYTOLOGY - PAP
Comment: NEGATIVE
High risk HPV: NEGATIVE

## 2023-02-20 ENCOUNTER — Other Ambulatory Visit: Payer: Commercial Managed Care - PPO

## 2023-02-20 DIAGNOSIS — R5383 Other fatigue: Secondary | ICD-10-CM | POA: Diagnosis not present

## 2023-02-20 NOTE — Addendum Note (Signed)
Addended by: Saralyn Pilar on: 02/20/2023 08:51 AM   Modules accepted: Orders

## 2023-02-21 ENCOUNTER — Other Ambulatory Visit (HOSPITAL_COMMUNITY): Payer: Self-pay

## 2023-02-21 ENCOUNTER — Other Ambulatory Visit: Payer: Self-pay | Admitting: Family Medicine

## 2023-02-21 DIAGNOSIS — E559 Vitamin D deficiency, unspecified: Secondary | ICD-10-CM

## 2023-02-21 LAB — VITAMIN D 25 HYDROXY (VIT D DEFICIENCY, FRACTURES): Vit D, 25-Hydroxy: 30 ng/mL (ref 30.0–100.0)

## 2023-02-21 MED ORDER — VITAMIN D (ERGOCALCIFEROL) 1.25 MG (50000 UNIT) PO CAPS
50000.0000 [IU] | ORAL_CAPSULE | ORAL | 0 refills | Status: DC
  Filled 2023-02-21: qty 6, 42d supply, fill #0

## 2023-03-09 ENCOUNTER — Other Ambulatory Visit (HOSPITAL_COMMUNITY): Payer: Self-pay

## 2023-03-14 DIAGNOSIS — Z6825 Body mass index (BMI) 25.0-25.9, adult: Secondary | ICD-10-CM | POA: Diagnosis not present

## 2023-03-14 DIAGNOSIS — M41125 Adolescent idiopathic scoliosis, thoracolumbar region: Secondary | ICD-10-CM | POA: Diagnosis not present

## 2023-03-15 DIAGNOSIS — Z79890 Hormone replacement therapy (postmenopausal): Secondary | ICD-10-CM | POA: Diagnosis not present

## 2023-03-15 DIAGNOSIS — Z780 Asymptomatic menopausal state: Secondary | ICD-10-CM | POA: Diagnosis not present

## 2023-03-15 DIAGNOSIS — R87619 Unspecified abnormal cytological findings in specimens from cervix uteri: Secondary | ICD-10-CM | POA: Diagnosis not present

## 2023-03-27 ENCOUNTER — Encounter: Payer: Self-pay | Admitting: Family Medicine

## 2023-03-27 DIAGNOSIS — N951 Menopausal and female climacteric states: Secondary | ICD-10-CM

## 2023-04-02 ENCOUNTER — Other Ambulatory Visit (HOSPITAL_COMMUNITY): Payer: Self-pay

## 2023-04-02 MED ORDER — ESTRADIOL 0.025 MG/24HR TD PTWK
0.0250 mg | MEDICATED_PATCH | TRANSDERMAL | 12 refills | Status: AC
  Filled 2023-04-02: qty 4, 28d supply, fill #0
  Filled 2023-05-02: qty 4, 28d supply, fill #1
  Filled 2023-05-25: qty 4, 28d supply, fill #2
  Filled 2023-06-12 – 2023-06-20 (×2): qty 4, 28d supply, fill #3
  Filled 2023-07-23: qty 4, 28d supply, fill #4
  Filled 2023-08-21: qty 4, 28d supply, fill #5
  Filled 2023-09-06 – 2023-09-12 (×2): qty 4, 28d supply, fill #6
  Filled 2023-10-05: qty 4, 28d supply, fill #7
  Filled 2023-11-02: qty 4, 28d supply, fill #8
  Filled 2023-11-25: qty 4, 28d supply, fill #9
  Filled 2023-12-21: qty 4, 28d supply, fill #10
  Filled 2024-01-19: qty 4, 28d supply, fill #11
  Filled 2024-02-10 – 2024-02-12 (×2): qty 4, 28d supply, fill #12

## 2023-04-02 MED ORDER — PROGESTERONE 200 MG PO CAPS
200.0000 mg | ORAL_CAPSULE | Freq: Every day | ORAL | 5 refills | Status: DC
  Filled 2023-04-02: qty 36, 84d supply, fill #0
  Filled 2023-06-20 – 2023-06-21 (×2): qty 36, 84d supply, fill #1

## 2023-04-02 NOTE — Addendum Note (Signed)
Addended by: Saralyn Pilar on: 04/02/2023 11:40 AM   Modules accepted: Orders

## 2023-05-04 ENCOUNTER — Other Ambulatory Visit (HOSPITAL_COMMUNITY): Payer: Self-pay

## 2023-05-04 MED ORDER — AZITHROMYCIN 500 MG PO TABS
ORAL_TABLET | ORAL | 0 refills | Status: DC
  Filled 2023-05-04: qty 4, 3d supply, fill #0

## 2023-05-22 ENCOUNTER — Ambulatory Visit: Payer: Commercial Managed Care - PPO | Admitting: Family Medicine

## 2023-05-22 ENCOUNTER — Encounter: Payer: Self-pay | Admitting: Family Medicine

## 2023-05-22 VITALS — BP 109/74 | HR 85 | Temp 97.9°F | Ht 67.0 in | Wt 157.8 lb

## 2023-05-22 DIAGNOSIS — N951 Menopausal and female climacteric states: Secondary | ICD-10-CM

## 2023-05-22 NOTE — Progress Notes (Signed)
   Established Patient Office Visit  Subjective   Patient ID: Samantha Floyd, female    DOB: Feb 11, 1971  Age: 52 y.o. MRN: [id number]  Chief Complaint  Patient presents with   Medication Management    HPI [name] is a 52 y.o. female presenting today for follow up of menopausal symptoms. She has felt immense improvement since starting the HRT and has liked the patch much better than the pills. Her energy is better, she no longer has back pain or fatigue. She notes that she has developed a small head tremor, but she would rather deal with that and enjoy the benefits of HRT. Switching the progesterone to night has relieved the sleepiness/dizziness.  ROS Negative unless otherwise noted in HPI   Objective:     BP 109/74   Pulse 85   Temp 97.9 F (36.6 C) (Oral)   Ht 5\' 7"  (1.702 m)   Wt 157 lb 12 oz (71.6 kg)   SpO2 98%   BMI 24.71 kg/m   Physical Exam Constitutional:      General: She is not in acute distress.    Appearance: Normal appearance.  HENT:     Head: Normocephalic and atraumatic.  Cardiovascular:     Rate and Rhythm: Normal rate and regular rhythm.     Heart sounds: No murmur heard.    No friction rub. No gallop.  Pulmonary:     Effort: Pulmonary effort is normal. No respiratory distress.     Breath sounds: No wheezing, rhonchi or rales.  Skin:    General: Skin is warm and dry.  Neurological:     Mental Status: She is alert and oriented to person, place, and time.      Assessment & Plan:  Vasomotor symptoms due to menopause Assessment & Plan: Continue estradiol 0.025 mg/24hr patch and progesterone 200 mg daily at bedtime. Will recheck CBC and CMP labs at next appt, will continue to monitor.     Return in about 3 months (around 08/22/2023) for follow-up for HRT, HLD, prediabetes, fasting blood work 1 week before. Order CBC, CMP, lipid panel, A1C.   Melida Quitter, PA

## 2023-05-22 NOTE — Assessment & Plan Note (Addendum)
Continue estradiol 0.025 mg/24hr patch and progesterone 200 mg daily at bedtime. Will recheck CBC and CMP labs at next appt, will continue to monitor.

## 2023-06-11 ENCOUNTER — Telehealth: Payer: Commercial Managed Care - PPO | Admitting: Family Medicine

## 2023-06-11 DIAGNOSIS — R3989 Other symptoms and signs involving the genitourinary system: Secondary | ICD-10-CM

## 2023-06-12 ENCOUNTER — Ambulatory Visit: Payer: Commercial Managed Care - PPO | Admitting: Family Medicine

## 2023-06-12 ENCOUNTER — Other Ambulatory Visit (HOSPITAL_COMMUNITY): Payer: Self-pay

## 2023-06-12 MED ORDER — CEPHALEXIN 500 MG PO CAPS
500.0000 mg | ORAL_CAPSULE | Freq: Two times a day (BID) | ORAL | 0 refills | Status: AC
Start: 2023-06-12 — End: 2023-06-19
  Filled 2023-06-12: qty 14, 7d supply, fill #0

## 2023-06-12 NOTE — Progress Notes (Signed)

## 2023-06-20 ENCOUNTER — Ambulatory Visit (INDEPENDENT_AMBULATORY_CARE_PROVIDER_SITE_OTHER): Payer: Commercial Managed Care - PPO | Admitting: Family Medicine

## 2023-06-20 ENCOUNTER — Ambulatory Visit: Payer: Commercial Managed Care - PPO

## 2023-06-20 ENCOUNTER — Other Ambulatory Visit (HOSPITAL_COMMUNITY): Payer: Self-pay

## 2023-06-20 ENCOUNTER — Encounter: Payer: Self-pay | Admitting: Family Medicine

## 2023-06-20 DIAGNOSIS — R3989 Other symptoms and signs involving the genitourinary system: Secondary | ICD-10-CM | POA: Diagnosis not present

## 2023-06-20 DIAGNOSIS — R319 Hematuria, unspecified: Secondary | ICD-10-CM

## 2023-06-20 DIAGNOSIS — N3000 Acute cystitis without hematuria: Secondary | ICD-10-CM

## 2023-06-20 DIAGNOSIS — R82998 Other abnormal findings in urine: Secondary | ICD-10-CM | POA: Diagnosis not present

## 2023-06-20 LAB — POCT URINALYSIS DIP (CLINITEK)
Bilirubin, UA: NEGATIVE
Glucose, UA: NEGATIVE mg/dL
Ketones, POC UA: NEGATIVE mg/dL
Nitrite, UA: NEGATIVE
POC PROTEIN,UA: NEGATIVE
Spec Grav, UA: 1.01 (ref 1.010–1.025)
Urobilinogen, UA: 0.2 E.U./dL
pH, UA: 6 (ref 5.0–8.0)

## 2023-06-20 MED ORDER — NITROFURANTOIN MONOHYD MACRO 100 MG PO CAPS
100.0000 mg | ORAL_CAPSULE | Freq: Two times a day (BID) | ORAL | 0 refills | Status: AC
Start: 2023-06-20 — End: 2023-06-26
  Filled 2023-06-20: qty 10, 5d supply, fill #0

## 2023-06-20 NOTE — Addendum Note (Signed)
Addended by: Saralyn Pilar on: 06/20/2023 04:55 PM   Modules accepted: Orders

## 2023-06-20 NOTE — Progress Notes (Signed)
SUBJECTIVE: Samantha Floyd is a 52 y.o. female who complains of urinary frequency, urgency and dysuria x 5 days, without flank pain, fever, chills, or abnormal vaginal discharge or bleeding.   . Urine dipstick shows positive for RBC's and positive for leukocytes.

## 2023-06-21 ENCOUNTER — Other Ambulatory Visit: Payer: Self-pay

## 2023-06-24 LAB — URINE CULTURE

## 2023-07-10 ENCOUNTER — Other Ambulatory Visit (HOSPITAL_COMMUNITY): Payer: Self-pay

## 2023-07-10 MED ORDER — SULFAMETHOXAZOLE-TRIMETHOPRIM 800-160 MG PO TABS
1.0000 | ORAL_TABLET | Freq: Two times a day (BID) | ORAL | 0 refills | Status: AC
Start: 2023-07-10 — End: 2023-07-14
  Filled 2023-07-10: qty 6, 3d supply, fill #0

## 2023-07-10 NOTE — Addendum Note (Signed)
Addended by: Saralyn Pilar on: 07/10/2023 12:33 PM   Modules accepted: Orders

## 2023-07-11 ENCOUNTER — Other Ambulatory Visit (HOSPITAL_COMMUNITY): Payer: Self-pay

## 2023-07-27 ENCOUNTER — Other Ambulatory Visit (HOSPITAL_COMMUNITY): Payer: Self-pay

## 2023-08-22 ENCOUNTER — Other Ambulatory Visit (HOSPITAL_COMMUNITY): Payer: Self-pay

## 2023-09-06 ENCOUNTER — Other Ambulatory Visit (HOSPITAL_COMMUNITY): Payer: Self-pay

## 2023-09-10 ENCOUNTER — Other Ambulatory Visit: Payer: Self-pay | Admitting: Family Medicine

## 2023-09-10 ENCOUNTER — Other Ambulatory Visit (HOSPITAL_COMMUNITY): Payer: Self-pay

## 2023-09-10 DIAGNOSIS — N951 Menopausal and female climacteric states: Secondary | ICD-10-CM

## 2023-09-10 MED ORDER — PROGESTERONE 200 MG PO CAPS
200.0000 mg | ORAL_CAPSULE | Freq: Every day | ORAL | 5 refills | Status: AC
Start: 2023-09-10 — End: ?
  Filled 2023-09-10: qty 12, 28d supply, fill #0
  Filled 2023-11-02: qty 12, 28d supply, fill #1
  Filled 2023-11-25: qty 12, 28d supply, fill #2
  Filled 2023-12-21: qty 12, 28d supply, fill #3
  Filled 2024-01-19: qty 12, 28d supply, fill #4
  Filled 2024-02-10: qty 12, 28d supply, fill #5

## 2023-11-13 ENCOUNTER — Other Ambulatory Visit: Payer: Self-pay | Admitting: Family Medicine

## 2023-11-13 DIAGNOSIS — Z0000 Encounter for general adult medical examination without abnormal findings: Secondary | ICD-10-CM

## 2023-11-28 ENCOUNTER — Ambulatory Visit
Admission: RE | Admit: 2023-11-28 | Discharge: 2023-11-28 | Disposition: A | Discharge status: Home / Self Care | Payer: Commercial Managed Care - PPO | Source: Ambulatory Visit | Attending: Family Medicine

## 2023-11-28 DIAGNOSIS — Z1231 Encounter for screening mammogram for malignant neoplasm of breast: Secondary | ICD-10-CM | POA: Diagnosis not present

## 2023-11-28 DIAGNOSIS — Z0000 Encounter for general adult medical examination without abnormal findings: Secondary | ICD-10-CM

## 2023-12-03 ENCOUNTER — Encounter: Payer: Self-pay | Admitting: Family Medicine

## 2023-12-06 ENCOUNTER — Encounter: Payer: Self-pay | Admitting: Family Medicine

## 2023-12-24 DIAGNOSIS — H524 Presbyopia: Secondary | ICD-10-CM | POA: Diagnosis not present

## 2024-01-29 ENCOUNTER — Telehealth: Payer: Self-pay | Admitting: Family Medicine

## 2024-01-29 NOTE — Telephone Encounter (Signed)
 Copied from CRM [id number]. Topic: Appointments - Scheduling Inquiry for Clinic >> Jan 29, 2024 12:17 PM Jon Gills C wrote: Reason for CRM: Patient called in wanting to be scheduled with Dr. Okey Dupre, informed her that she is not accepting new patient. 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 Okey Dupre will take her as new patient  [phone]

## 2024-02-04 NOTE — Telephone Encounter (Signed)
 Unfortunately I am not able to take new patients right now sorry.

## 2024-02-11 ENCOUNTER — Other Ambulatory Visit (HOSPITAL_COMMUNITY): Payer: Self-pay

## 2024-02-11 ENCOUNTER — Other Ambulatory Visit: Payer: Self-pay

## 2024-02-19 ENCOUNTER — Encounter: Payer: Commercial Managed Care - PPO | Admitting: Family Medicine

## 2024-03-04 ENCOUNTER — Other Ambulatory Visit (HOSPITAL_COMMUNITY): Payer: Self-pay

## 2024-03-04 DIAGNOSIS — Z01419 Encounter for gynecological examination (general) (routine) without abnormal findings: Secondary | ICD-10-CM | POA: Diagnosis not present

## 2024-03-04 DIAGNOSIS — Z130 Encounter for screening for diseases of the blood and blood-forming organs and certain disorders involving the immune mechanism: Secondary | ICD-10-CM | POA: Diagnosis not present

## 2024-03-04 DIAGNOSIS — Z79890 Hormone replacement therapy (postmenopausal): Secondary | ICD-10-CM | POA: Diagnosis not present

## 2024-03-04 DIAGNOSIS — Z1389 Encounter for screening for other disorder: Secondary | ICD-10-CM | POA: Diagnosis not present

## 2024-03-04 MED ORDER — PROGESTERONE MICRONIZED 100 MG PO CAPS
100.0000 mg | ORAL_CAPSULE | Freq: Every day | ORAL | 5 refills | Status: AC
  Filled 2024-03-04: qty 36, 84d supply, fill #0
  Filled 2024-05-23: qty 36, 84d supply, fill #1
  Filled 2024-07-30 – 2024-08-01 (×2): qty 36, 84d supply, fill #2
  Filled 2024-10-26: qty 36, 84d supply, fill #3

## 2024-03-04 MED ORDER — ESTRADIOL 0.05 MG/24HR TD PTTW
1.0000 | MEDICATED_PATCH | TRANSDERMAL | 1 refills | Status: AC
Start: 2024-03-06 — End: ?
  Filled 2024-03-04: qty 24, 84d supply, fill #0
  Filled 2024-04-17 – 2024-05-23 (×2): qty 24, 84d supply, fill #1

## 2024-03-11 DIAGNOSIS — M419 Scoliosis, unspecified: Secondary | ICD-10-CM | POA: Diagnosis not present

## 2024-03-11 DIAGNOSIS — R946 Abnormal results of thyroid function studies: Secondary | ICD-10-CM | POA: Diagnosis not present

## 2024-03-11 DIAGNOSIS — D649 Anemia, unspecified: Secondary | ICD-10-CM | POA: Diagnosis not present

## 2024-03-11 DIAGNOSIS — Z0000 Encounter for general adult medical examination without abnormal findings: Secondary | ICD-10-CM | POA: Diagnosis not present

## 2024-03-11 DIAGNOSIS — H6593 Unspecified nonsuppurative otitis media, bilateral: Secondary | ICD-10-CM | POA: Diagnosis not present

## 2024-03-11 DIAGNOSIS — E559 Vitamin D deficiency, unspecified: Secondary | ICD-10-CM | POA: Diagnosis not present

## 2024-03-11 DIAGNOSIS — E7800 Pure hypercholesterolemia, unspecified: Secondary | ICD-10-CM | POA: Diagnosis not present

## 2024-03-11 DIAGNOSIS — R7303 Prediabetes: Secondary | ICD-10-CM | POA: Diagnosis not present

## 2024-03-18 DIAGNOSIS — E7800 Pure hypercholesterolemia, unspecified: Secondary | ICD-10-CM | POA: Diagnosis not present

## 2024-03-18 DIAGNOSIS — R7989 Other specified abnormal findings of blood chemistry: Secondary | ICD-10-CM | POA: Diagnosis not present

## 2024-03-18 DIAGNOSIS — R946 Abnormal results of thyroid function studies: Secondary | ICD-10-CM | POA: Diagnosis not present

## 2024-03-18 DIAGNOSIS — D649 Anemia, unspecified: Secondary | ICD-10-CM | POA: Diagnosis not present

## 2024-03-18 DIAGNOSIS — E559 Vitamin D deficiency, unspecified: Secondary | ICD-10-CM | POA: Diagnosis not present

## 2024-03-18 DIAGNOSIS — R799 Abnormal finding of blood chemistry, unspecified: Secondary | ICD-10-CM | POA: Diagnosis not present

## 2024-03-18 DIAGNOSIS — R7303 Prediabetes: Secondary | ICD-10-CM | POA: Diagnosis not present

## 2024-04-15 ENCOUNTER — Other Ambulatory Visit (HOSPITAL_COMMUNITY): Payer: Self-pay

## 2024-04-15 MED ORDER — AZITHROMYCIN 250 MG PO TABS
ORAL_TABLET | ORAL | 0 refills | Status: AC
  Filled 2024-04-15 – 2024-04-16 (×2): qty 6, 5d supply, fill #0

## 2024-04-15 MED ORDER — SULFAMETHOXAZOLE-TRIMETHOPRIM 800-160 MG PO TABS
1.0000 | ORAL_TABLET | Freq: Two times a day (BID) | ORAL | 0 refills | Status: AC
  Filled 2024-04-15: qty 6, 3d supply, fill #0

## 2024-04-15 MED ORDER — ESTRADIOL 0.05 MG/24HR TD PTTW
1.0000 | MEDICATED_PATCH | TRANSDERMAL | 4 refills | Status: AC
  Filled 2024-04-15 – 2024-08-01 (×3): qty 24, 84d supply, fill #0
  Filled 2024-10-26: qty 24, 84d supply, fill #1

## 2024-04-16 ENCOUNTER — Other Ambulatory Visit (HOSPITAL_COMMUNITY): Payer: Self-pay

## 2024-04-17 ENCOUNTER — Other Ambulatory Visit (HOSPITAL_COMMUNITY): Payer: Self-pay

## 2024-07-01 ENCOUNTER — Other Ambulatory Visit (HOSPITAL_COMMUNITY): Payer: Self-pay

## 2024-07-01 ENCOUNTER — Telehealth: Admitting: Physician Assistant

## 2024-07-01 DIAGNOSIS — J208 Acute bronchitis due to other specified organisms: Secondary | ICD-10-CM | POA: Diagnosis not present

## 2024-07-01 DIAGNOSIS — B9689 Other specified bacterial agents as the cause of diseases classified elsewhere: Secondary | ICD-10-CM

## 2024-07-01 MED ORDER — BENZONATATE 100 MG PO CAPS
100.0000 mg | ORAL_CAPSULE | Freq: Three times a day (TID) | ORAL | 0 refills | Status: AC | PRN
  Filled 2024-07-01: qty 30, 10d supply, fill #0

## 2024-07-01 MED ORDER — OMRON 3 SERIES BP MONITOR DEVI
0 refills | Status: AC
  Filled 2024-07-01: qty 1, 90d supply, fill #0

## 2024-07-01 MED ORDER — AZITHROMYCIN 250 MG PO TABS
ORAL_TABLET | ORAL | 0 refills | Status: AC
  Filled 2024-07-01: qty 6, 5d supply, fill #0

## 2024-07-01 NOTE — Progress Notes (Signed)
 I have spent 5 minutes in review of e-visit questionnaire, review and updating patient chart, medical decision making and response to patient.   Elsie Velma Lunger, PA-C

## 2024-07-01 NOTE — Progress Notes (Signed)
 E-Visit for Cough   We are sorry that you are not feeling well.  Here is how we plan to help!  Based on your presentation I believe you most likely have A cough due to bacteria.  When patients have a productive cough with a change in color or increased sputum production, we are concerned about bacterial bronchitis.  If left untreated it can progress to pneumonia.  If your symptoms do not improve with your treatment plan it is important that you contact your provider.   I have prescribed Azithromyin 250 mg: two tablets now and then one tablet daily for 4 additonal days    In addition you may use A prescription cough medication called Tessalon  Perles 100mg . You may take 1-2 capsules every 8 hours as needed for your cough.  I do recommend you continue your Fluticasone nasal spray to help further with nasal congestion.  From your responses in the eVisit questionnaire you describe inflammation in the upper respiratory tract which is causing a significant cough.  This is commonly called Bronchitis and has four common causes:   Allergies Viral Infections Acid Reflux Bacterial Infection Allergies, viruses and acid reflux are treated by controlling symptoms or eliminating the cause. An example might be a cough caused by taking certain blood pressure medications. You stop the cough by changing the medication. Another example might be a cough caused by acid reflux. Controlling the reflux helps control the cough.  USE OF BRONCHODILATOR (RESCUE) INHALERS: There is a risk from using your bronchodilator too frequently.  The risk is that over-reliance on a medication which only relaxes the muscles surrounding the breathing tubes can reduce the effectiveness of medications prescribed to reduce swelling and congestion of the tubes themselves.  Although you feel brief relief from the bronchodilator inhaler, your asthma may actually be worsening with the tubes becoming more swollen and filled with mucus.  This can  delay other crucial treatments, such as oral steroid medications. If you need to use a bronchodilator inhaler daily, several times per day, you should discuss this with your provider.  There are probably better treatments that could be used to keep your asthma under control.     HOME CARE Only take medications as instructed by your medical team. Complete the entire course of an antibiotic. Drink plenty of fluids and get plenty of rest. Avoid close contacts especially the very young and the elderly Cover your mouth if you cough or cough into your sleeve. Always remember to wash your hands A steam or ultrasonic humidifier can help congestion.   GET HELP RIGHT AWAY IF: You develop worsening fever. You become short of breath You cough up blood. Your symptoms persist after you have completed your treatment plan MAKE SURE YOU  Understand these instructions. Will watch your condition. Will get help right away if you are not doing well or get worse.    Thank you for choosing an e-visit.  Your e-visit answers were reviewed by a board certified advanced clinical practitioner to complete your personal care plan. Depending upon the condition, your plan could have included both over the counter or prescription medications.  Please review your pharmacy choice. Make sure the pharmacy is open so you can pick up prescription now. If there is a problem, you may contact your provider through [employer] and have the prescription routed to another pharmacy.  Your safety is important to us . If you have drug allergies check your prescription carefully.   For the next 24 hours  you can use MyChart to ask questions about today's visit, request a non-urgent call back, or ask for a work or school excuse. You will get an email in the next two days asking about your experience. I hope that your e-visit has been valuable and will speed your recovery.

## 2024-07-30 ENCOUNTER — Other Ambulatory Visit (HOSPITAL_COMMUNITY): Payer: Self-pay

## 2024-08-01 ENCOUNTER — Other Ambulatory Visit: Payer: Self-pay

## 2024-08-01 ENCOUNTER — Other Ambulatory Visit (HOSPITAL_COMMUNITY): Payer: Self-pay

## 2024-10-29 ENCOUNTER — Other Ambulatory Visit: Payer: Self-pay

## 2024-10-29 ENCOUNTER — Other Ambulatory Visit (HOSPITAL_COMMUNITY): Payer: Self-pay

## 2024-10-29 ENCOUNTER — Encounter: Payer: Self-pay | Admitting: Pharmacist
